# Patient Record
Sex: Female | Born: 1935 | Race: Black or African American | Hispanic: No | Smoking: Never smoker
Health system: Southern US, Community
[De-identification: ages and names within clinical notes are randomized; demographics above are authoritative.]

## PROBLEM LIST (undated history)

## (undated) DIAGNOSIS — I441 Atrioventricular block, second degree: Secondary | ICD-10-CM

## (undated) DIAGNOSIS — F0390 Unspecified dementia without behavioral disturbance: Secondary | ICD-10-CM

## (undated) DIAGNOSIS — M1990 Unspecified osteoarthritis, unspecified site: Secondary | ICD-10-CM

## (undated) DIAGNOSIS — M109 Gout, unspecified: Secondary | ICD-10-CM

## (undated) DIAGNOSIS — I10 Essential (primary) hypertension: Secondary | ICD-10-CM

## (undated) DIAGNOSIS — Z950 Presence of cardiac pacemaker: Secondary | ICD-10-CM

## (undated) HISTORY — PX: CHOLECYSTECTOMY: SHX55

## (undated) HISTORY — DX: Atrioventricular block, second degree: I44.1

## (undated) HISTORY — PX: BACK SURGERY: SHX140

## (undated) HISTORY — PX: CATARACT EXTRACTION, BILATERAL: SHX1313

---

## 1997-07-22 ENCOUNTER — Ambulatory Visit (HOSPITAL_COMMUNITY): Admission: RE | Admit: 1997-07-22 | Discharge: 1997-07-22 | Payer: Self-pay | Admitting: Urology

## 1998-12-04 ENCOUNTER — Emergency Department (HOSPITAL_COMMUNITY): Admission: EM | Admit: 1998-12-04 | Discharge: 1998-12-04 | Payer: Self-pay | Admitting: Emergency Medicine

## 1998-12-04 ENCOUNTER — Encounter: Payer: Self-pay | Admitting: Emergency Medicine

## 1999-05-18 ENCOUNTER — Other Ambulatory Visit: Admission: RE | Admit: 1999-05-18 | Discharge: 1999-05-18 | Payer: Self-pay | Admitting: Geriatric Medicine

## 1999-10-19 ENCOUNTER — Encounter: Payer: Self-pay | Admitting: Emergency Medicine

## 1999-10-19 ENCOUNTER — Emergency Department (HOSPITAL_COMMUNITY): Admission: EM | Admit: 1999-10-19 | Discharge: 1999-10-19 | Payer: Self-pay | Admitting: Ophthalmology

## 1999-12-02 ENCOUNTER — Encounter: Payer: Self-pay | Admitting: Geriatric Medicine

## 1999-12-02 ENCOUNTER — Encounter: Admission: RE | Admit: 1999-12-02 | Discharge: 1999-12-02 | Payer: Self-pay | Admitting: Geriatric Medicine

## 2001-01-03 ENCOUNTER — Encounter: Admission: RE | Admit: 2001-01-03 | Discharge: 2001-01-29 | Payer: Self-pay | Admitting: Geriatric Medicine

## 2001-04-29 ENCOUNTER — Ambulatory Visit (HOSPITAL_COMMUNITY): Admission: RE | Admit: 2001-04-29 | Discharge: 2001-04-29 | Payer: Self-pay | Admitting: Specialist

## 2001-06-19 ENCOUNTER — Ambulatory Visit (HOSPITAL_COMMUNITY): Admission: RE | Admit: 2001-06-19 | Discharge: 2001-06-19 | Payer: Self-pay | Admitting: Specialist

## 2001-09-16 ENCOUNTER — Encounter: Admission: RE | Admit: 2001-09-16 | Discharge: 2001-09-16 | Payer: Self-pay | Admitting: Geriatric Medicine

## 2001-09-16 ENCOUNTER — Encounter: Payer: Self-pay | Admitting: Geriatric Medicine

## 2001-10-29 ENCOUNTER — Encounter: Payer: Self-pay | Admitting: General Surgery

## 2001-10-29 ENCOUNTER — Encounter (INDEPENDENT_AMBULATORY_CARE_PROVIDER_SITE_OTHER): Payer: Self-pay | Admitting: Specialist

## 2001-10-29 ENCOUNTER — Observation Stay (HOSPITAL_COMMUNITY): Admission: RE | Admit: 2001-10-29 | Discharge: 2001-10-30 | Payer: Self-pay | Admitting: General Surgery

## 2002-02-19 ENCOUNTER — Encounter: Payer: Self-pay | Admitting: Geriatric Medicine

## 2002-02-19 ENCOUNTER — Encounter: Admission: RE | Admit: 2002-02-19 | Discharge: 2002-02-19 | Payer: Self-pay | Admitting: Geriatric Medicine

## 2002-05-20 ENCOUNTER — Ambulatory Visit (HOSPITAL_COMMUNITY): Admission: RE | Admit: 2002-05-20 | Discharge: 2002-05-20 | Payer: Self-pay | Admitting: Geriatric Medicine

## 2002-05-29 ENCOUNTER — Encounter: Admission: RE | Admit: 2002-05-29 | Discharge: 2002-05-29 | Payer: Self-pay | Admitting: Geriatric Medicine

## 2002-05-29 ENCOUNTER — Encounter: Payer: Self-pay | Admitting: Geriatric Medicine

## 2002-12-01 ENCOUNTER — Encounter: Admission: RE | Admit: 2002-12-01 | Discharge: 2002-12-01 | Payer: Self-pay | Admitting: Geriatric Medicine

## 2002-12-01 ENCOUNTER — Encounter: Payer: Self-pay | Admitting: Geriatric Medicine

## 2004-04-13 ENCOUNTER — Encounter: Admission: RE | Admit: 2004-04-13 | Discharge: 2004-04-13 | Payer: Self-pay | Admitting: Geriatric Medicine

## 2005-02-17 ENCOUNTER — Observation Stay (HOSPITAL_COMMUNITY): Admission: EM | Admit: 2005-02-17 | Discharge: 2005-02-19 | Payer: Self-pay | Admitting: Emergency Medicine

## 2005-06-27 ENCOUNTER — Encounter: Admission: RE | Admit: 2005-06-27 | Discharge: 2005-06-27 | Payer: Self-pay | Admitting: Geriatric Medicine

## 2006-05-01 ENCOUNTER — Encounter: Admission: RE | Admit: 2006-05-01 | Discharge: 2006-05-01 | Payer: Self-pay | Admitting: Geriatric Medicine

## 2007-05-06 ENCOUNTER — Encounter: Admission: RE | Admit: 2007-05-06 | Discharge: 2007-05-06 | Payer: Self-pay | Admitting: Geriatric Medicine

## 2007-05-22 ENCOUNTER — Encounter: Admission: RE | Admit: 2007-05-22 | Discharge: 2007-05-22 | Payer: Self-pay | Admitting: Geriatric Medicine

## 2007-06-06 ENCOUNTER — Encounter: Admission: RE | Admit: 2007-06-06 | Discharge: 2007-06-06 | Payer: Self-pay | Admitting: Geriatric Medicine

## 2008-02-07 ENCOUNTER — Encounter: Admission: RE | Admit: 2008-02-07 | Discharge: 2008-02-07 | Payer: Self-pay | Admitting: Geriatric Medicine

## 2010-09-02 NOTE — H&P (Signed)
[name] Fields, NASBY NO.:  [id number]   MEDICAL RECORD NO.:  [id number]          PATIENT TYPE:  EMS   LOCATION:  ED                           FACILITY:  [facility]   PHYSICIAN:  Candyce Churn, M.D.DATE OF BIRTH:  1910/05/07   DATE OF ADMISSION:  02/17/2005  DATE OF DISCHARGE:                                HISTORY & PHYSICAL   CHIEF COMPLAINT:  Weakness, tremors, and inability to walk or stand.   HISTORY OF PRESENT ILLNESS:  Ms. [name] Fields is a very pleasant 77-  year-old female with a history of:  1.  Hypertension.  2.  Type 2 diabetes mellitus.  3.  Degenerative disk disease with scoliosis of the spine.  4.  Hiatal hernia with gastroesophageal reflux disease.  5.  Glaucoma.   The patient developed a congestive cough and wheezing 5-6 days ago, and was  seen at [facility], and was started on Avelox 400 mg daily  and nebulizer treatments at home. Today, she took her fifth day of Avelox  and has 5 more days. She started having weakness and tremor today and found  it hard to walk to stand and in the emergency room also has dysmetria. She  has poor finger to nose coordination. She is generally weak and does not  feel like she can stand or walk. She has no focal or localizing symptoms.  She denies fever or chills. Chest congestion has improved and she is  admitted now for inability to walk or stand and wonders if she may be  reacting to Avelox.   PAST MEDICAL HISTORY:  As above.   PAST SURGICAL HISTORY:  1.  Total abdominal hysterectomy secondary to tumor.  2.  Back surgery x3.  3.  Appendectomy.  4.  Laparoscopic cholecystectomy in 2003.   MEDICATIONS:  1.  Glipizide 10 mg daily.  2.  Micardis 40 mg daily.  3.  Travatan eye drops, 1 drop in each eye at bedtime.  4.  Avelox 400 mg daily. Today was the 5th day.  5.  Albuterol nebulizer treatments for 5 days, by history.   FAMILY HISTORY:  Noncontributory.   SOCIAL HISTORY:   No alcohol, no tobacco, no drugs. The patient is widowed.  She used to be a cook. Has 2 children in the emergency room with her. She  has been a member of her church for 72 years and was born and raised and  lived her entire life in [place].   REVIEW OF SYSTEMS:  Denies fever, chills, dysuria, abdominal pain. She feel  1 week ago and still has a mildly sore chest to palpation over the sternum.  No history of congestive heart failure. No PND or orthopnea. Denies  diarrhea.   PHYSICAL EXAMINATION:  GENERAL:  An alert female. Conversive. Elderly. Mild  decreased hearing generally.  HEENT:  Normocephalic and atraumatic.  NECK:  Supple without jugular venous distention.  CHEST:  Clear to auscultation.  CARDIAC:  Regular rate and rhythm. No murmur, rub, or gallop.  ABDOMEN:  Soft, non-tender. Normal bowel sounds. Non-distended.  EXTREMITIES:  Without clubbing,  cyanosis, or edema.  NEUROLOGIC:  She is oriented x3. She has 4/5 strength in the lower  extremities and the upper extremities. No localizing deficits. She is too  weak to stand.  SKIN:  Without rashes.   LABORATORY DATA:  Non-contrast head CT is nonacute.   White count 8900. Hemoglobin 11.6. Platelet count 287,000, normal  differential.  Sodium decreased to 128. Potassium 5.3. chloride 98, bicarb  22, BUN 23, creatinine 1.2, glucose 88.  LFTs are normal. Urinalysis tested  0-2 white cells and 3-6 red cells and a few bacteria. The rest is negative.   ASSESSMENT:  A 77 year old female with treatment for bronchitis for 5 days  and now with bronchitis/respiratory infection x5 days, and now with  increased weakness and incoordination but nonfocal. I wonder if this may be  a drug toxic effect from the quinolone therapy. She could have had possibly  had a small cerebrovascular accident, but I doubt. Certainly it is a  nonfocal examination. She also has a mild to moderate hyponatremia, and this  may be mild syndrome of inappropriate  secretion of antidiuretic hormone.   PLAN:  1.  Discontinue Avelox and nebulizer therapy.  2.  Treat with regular diet and hold on IV fluids for now. May have mild      SIADH.  3.  Physical therapy to try to assess strength in ambulatory status on a      daily basis.  4.  Diabetes mellitus. Use sliding scale regular insulin. Hold Glipizide for      now.  5.  Hypertension. Hold Micardis for now. Restart if and when blood pressure      elevates.  6.  Hyponatremia. Will not treat with IV fluids for now but will follow on a      daily basis. Hopefully, this will slowly improve.      Candyce Churn, M.D.  Electronically Signed     RNG/MEDQ  D:  02/18/2005  T:  02/18/2005  Job:  [id number]   cc:   Hal T. Stoneking, M.D.  Fax: [phone]   Sherin Quarry, MD

## 2010-09-02 NOTE — Discharge Summary (Signed)
NAMEKEGAN, SHEPARDSON              ACCOUNT NO.:  [id number]   MEDICAL RECORD NO.:  [id number]          PATIENT TYPE:  INP   LOCATION:  1511                         FACILITY:  [facility]   PHYSICIAN:  Sherin Quarry, MD      DATE OF BIRTH:  Sep 27, 1910   DATE OF ADMISSION:  02/17/2005  DATE OF DISCHARGE:                                 DISCHARGE SUMMARY   [name] Fields is a 77 year old lady with a history of hypertension and type  2 diabetes who about 6 days prior to admission developed a cough with  associated wheezing. She was seen at [facility] and was  placed on Avelox 400 mg daily as well as nebulizer treatments. On February 17, 2005, the patient began to complain of weakness and tremor, as well as  difficulty with finger-nose coordination. She felt very weak. She was  therefore brought to the [facility] emergency room where she was seen by  Dr. Johnella Moloney. On physical exam, she was alert, she reported some  hearing deficit. Her blood pressure was 173/63, heart rate was 65,  temperature was 98.4. HEENT exam was within normal limits. The chest was  clear. Cardiovascular exam revealed normal S1 and S2 without rubs, murmurs,  or gallops. The abdomen was benign. Neurologic testing and examination  extremities were remarkable only for diffuse weakness. Relevant laboratory  studies obtained during the course of the hospitalization included a CBC  which showed a hemoglobin of 11.6, white count of 8900. CMET was notable for  a sodium of 128. Urinalysis was essentially negative. TSH was normal.  Sedimentation rate was 47. It was Dr. Kevan Ny' impression that the patient  probably was suffering side effects from medications. He did send her for a  CT scan of the brain which showed no evidence of acute intracranial  abnormality. When I saw her on November 4, I noted that she seemed to be  feeling better. She still complained of some leg weakness. She also  apparently fallen as a  result of the weakness and complained of some  soreness in the area around the right eye as well as the chest area  diffusely. These areas were tender to palpation. By November 5 she said that  she was feeling better although she continued to have some aching and  soreness. A repeat BMET showed the sodium level to be 135. It seemed  reasonable to conclude that her symptoms were in fact side effects of  medication and I contacted her family and suggested that she could probably  return home. I advised them to keep Dr. Pete Glatter posted about her overall  status. Therefore, on November 5 the patient was discharged.   DISCHARGE DIAGNOSES:  1.  Weakness, dizziness, and mild tremor probably secondary to medication      side effects.  2.  Diabetes.  3.  History of gastroesophageal reflux.  4.  Glaucoma.   On discharge the patient will resume her usual medications. These are:  1.  Glipizide 10 mg daily.  2.  Micardis 40 mg daily.  3.  Travatan eye drops one  drop each eye at bedtime.   She is counseled not to take Avelox or to use the nebulizer. She is  counseled to report on how she is doing to Dr. Laverle Hobby office next week.           ______________________________  Sherin Quarry, MD     SY/MEDQ  D:  02/19/2005  T:  02/20/2005  Job:  [id number]   cc:   Hal T. Stoneking, M.D.  Fax: [phone]

## 2010-09-02 NOTE — Op Note (Signed)
[facility]  Patient:    ARLEEN, BAR Visit Number: [id number] MRN: [id number]          Service Type: SUR Location: 3W 4782 [id number] Attending Physician:  Carson Myrtle Dictated by:   Sheppard Plumber Earlene Plater, M.D. Proc. Date: 10/29/01 Admit Date:  10/29/2001 Discharge Date: 10/30/2001   CC:         Hal T. Stoneking, M.D.   Operative Report  PREOPERATIVE DIAGNOSIS:  Chronic cholecystolithiasis.  POSTOPERATIVE DIAGNOSIS:  Chronic cholecystolithiasis.  PROCEDURE:  Laparoscopic cholecystectomy and operative cholangiogram.  SURGEON:  Timothy E. Earlene Plater, M.D.  ASSISTANT:  Gita Kudo, M.D.  ANESTHESIA:  General.  INDICATIONS FOR PROCEDURE:  This is an 77 year old female with chronic cholecystolithiasis, multiple small stones, occasional episodes of elevation of SGOT. She has been carefully prepared and is now ready to proceed with laparoscopic cholecystectomy.  DESCRIPTION OF PROCEDURE:  She was taken to the operating room after evaluation by anesthesia and identification, placed supine, general endotracheal anesthesia administered. The abdomen was carefully prepped and draped in the usual fashion. A horizontal incision made infraumbilically after injection of local anesthesia. The fascia identified vertically, the Hasson catheter placed, tied in place with #1 Vicryl. The abdomen insufflated, general peritoneoscopy revealed lower midline infra-abdominal adhesions. The upper abdomen was clear. A second 10 mm trocar placed in the mid epigastrium, two 5 mm trocars in the right upper quadrant. The gallbladder was identified, appeared chronically inflamed, had minimal adhesions. It was grasped and placed on tension. Careful dissection at the base of the gallbladder revealed a normal appearing cystic duct entering the gallbladder with a small cystic artery behind it. These were dissected out completely. The cystic duct was clipped near the  gallbladder. The cystic duct was opened and a catheter was placed there and clipped in place. A real-time cholangiography was done with the C-arm fluoroscopy showing easy smooth flow of dye into the common duct system and into the duodenum. There was no evidence of obstruction of enlargement. The right and left hepatic radicles did show up. I saw no abnormality. We proceeded to remove the catheter, triply clip the remnant of the cystic duct and completely divide it. Behind that was a normal appearing cystic artery that was quadruply clipped and divided. The gallbladder was then removed from the gallbladder bed without complications or incident. The bed of the gallbladder was cauterized to control oozing. The gallbladder bed was dry, it was copiously irrigated. The gallbladder was then removed through the infraumbilical incision which was then tied under direct vision. Copious irrigation was used, i.e. 2 liters. All irrigant removed. Inspection of all areas was negative. Then all irrigant, CO2, instruments and trocars removed under direct vision. She had tolerated it well, the counts were correct. Each incision checked and cauterized where needed and the skin closed with 3-0 Monocryl. Steri-Strips applied, dry sterile dressing applied and she was awakened and taken to the recovery room. Final counts correct. Dictated by:   Sheppard Plumber Earlene Plater, M.D. Attending Physician:  Carson Myrtle DD:  10/29/01 TD:  11/01/01 Job: [id number]

## 2011-01-29 ENCOUNTER — Emergency Department (HOSPITAL_COMMUNITY): Payer: Medicare Other

## 2011-01-29 ENCOUNTER — Emergency Department (HOSPITAL_COMMUNITY)
Admission: EM | Admit: 2011-01-29 | Discharge: 2011-01-29 | Disposition: A | Discharge status: Home / Self Care | Payer: Medicare Other | Attending: Emergency Medicine | Admitting: Emergency Medicine

## 2011-01-29 DIAGNOSIS — E119 Type 2 diabetes mellitus without complications: Secondary | ICD-10-CM | POA: Insufficient documentation

## 2011-01-29 DIAGNOSIS — M109 Gout, unspecified: Secondary | ICD-10-CM | POA: Insufficient documentation

## 2011-01-29 DIAGNOSIS — I10 Essential (primary) hypertension: Secondary | ICD-10-CM | POA: Insufficient documentation

## 2011-01-29 DIAGNOSIS — M25539 Pain in unspecified wrist: Secondary | ICD-10-CM | POA: Insufficient documentation

## 2011-01-29 LAB — GLUCOSE, CAPILLARY: Glucose-Capillary: 60 mg/dL — ABNORMAL LOW (ref 70–99)

## 2011-01-29 LAB — URIC ACID: Uric Acid, Serum: 8.4 mg/dL — ABNORMAL HIGH (ref 2.4–7.0)

## 2011-03-18 DIAGNOSIS — M109 Gout, unspecified: Secondary | ICD-10-CM

## 2011-03-18 HISTORY — DX: Gout, unspecified: M10.9

## 2011-04-17 ENCOUNTER — Emergency Department (HOSPITAL_COMMUNITY): Payer: Medicare Other

## 2011-04-17 ENCOUNTER — Inpatient Hospital Stay (HOSPITAL_COMMUNITY)
Admission: EM | Admit: 2011-04-17 | Discharge: 2011-04-21 | DRG: 244 | Disposition: A | Discharge status: Skilled Nursing Facility | Payer: Medicare Other | Attending: Internal Medicine | Admitting: Internal Medicine

## 2011-04-17 ENCOUNTER — Encounter: Payer: Self-pay | Admitting: Emergency Medicine

## 2011-04-17 DIAGNOSIS — Z888 Allergy status to other drugs, medicaments and biological substances status: Secondary | ICD-10-CM

## 2011-04-17 DIAGNOSIS — E119 Type 2 diabetes mellitus without complications: Secondary | ICD-10-CM | POA: Diagnosis present

## 2011-04-17 DIAGNOSIS — I441 Atrioventricular block, second degree: Secondary | ICD-10-CM

## 2011-04-17 DIAGNOSIS — Z79899 Other long term (current) drug therapy: Secondary | ICD-10-CM

## 2011-04-17 DIAGNOSIS — I10 Essential (primary) hypertension: Secondary | ICD-10-CM | POA: Diagnosis present

## 2011-04-17 DIAGNOSIS — I447 Left bundle-branch block, unspecified: Secondary | ICD-10-CM | POA: Diagnosis present

## 2011-04-17 HISTORY — DX: Unspecified osteoarthritis, unspecified site: M19.90

## 2011-04-17 HISTORY — DX: Essential (primary) hypertension: I10

## 2011-04-17 HISTORY — DX: Gout, unspecified: M10.9

## 2011-04-17 LAB — CBC
HCT: 34 % — ABNORMAL LOW (ref 36.0–46.0)
Hemoglobin: 11 g/dL — ABNORMAL LOW (ref 12.0–15.0)
MCH: 29.7 pg (ref 26.0–34.0)
MCHC: 32.4 g/dL (ref 30.0–36.0)
MCV: 91.9 fL (ref 78.0–100.0)
Platelets: 233 10*3/uL (ref 150–400)
RBC: 3.7 MIL/uL — ABNORMAL LOW (ref 3.87–5.11)
RDW: 13.4 % (ref 11.5–15.5)
WBC: 7.6 10*3/uL (ref 4.0–10.5)

## 2011-04-17 LAB — DIFFERENTIAL
Basophils Absolute: 0 10*3/uL (ref 0.0–0.1)
Basophils Relative: 0 % (ref 0–1)
Eosinophils Absolute: 0.1 10*3/uL (ref 0.0–0.7)
Eosinophils Relative: 1 % (ref 0–5)
Lymphocytes Relative: 28 % (ref 12–46)
Lymphs Abs: 2.1 10*3/uL (ref 0.7–4.0)
Monocytes Absolute: 0.7 10*3/uL (ref 0.1–1.0)
Monocytes Relative: 10 % (ref 3–12)
Neutro Abs: 4.6 10*3/uL (ref 1.7–7.7)
Neutrophils Relative %: 61 % (ref 43–77)

## 2011-04-17 LAB — COMPREHENSIVE METABOLIC PANEL
ALT: 15 U/L (ref 0–35)
AST: 20 U/L (ref 0–37)
Albumin: 3.5 g/dL (ref 3.5–5.2)
Alkaline Phosphatase: 44 U/L (ref 39–117)
BUN: 38 mg/dL — ABNORMAL HIGH (ref 6–23)
CO2: 23 mEq/L (ref 19–32)
Calcium: 9.7 mg/dL (ref 8.4–10.5)
Chloride: 109 mEq/L (ref 96–112)
Creatinine, Ser: 1.46 mg/dL — ABNORMAL HIGH (ref 0.50–1.10)
GFR calc Af Amer: 33 mL/min — ABNORMAL LOW (ref >90)
GFR calc non Af Amer: 29 mL/min — ABNORMAL LOW (ref >90)
Glucose, Bld: 118 mg/dL — ABNORMAL HIGH (ref 70–99)
Potassium: 5.6 mEq/L — ABNORMAL HIGH (ref 3.5–5.1)
Sodium: 140 mEq/L (ref 135–145)
Total Bilirubin: 0.2 mg/dL — ABNORMAL LOW (ref 0.3–1.2)
Total Protein: 7.3 g/dL (ref 6.0–8.3)

## 2011-04-17 LAB — CARDIAC PANEL(CRET KIN+CKTOT+MB+TROPI)
CK, MB: 3.9 ng/mL (ref 0.3–4.0)
Relative Index: 3.5 — ABNORMAL HIGH (ref 0.0–2.5)
Total CK: 113 U/L (ref 7–177)
Troponin I: <0.3 ng/mL (ref <0.30)

## 2011-04-17 MED ORDER — SODIUM POLYSTYRENE SULFONATE 15 GM/60ML PO SUSP
15.0000 g | Freq: Once | ORAL | Status: AC
  Administered 2011-04-18: 15 g via ORAL
  Filled 2011-04-17: qty 60

## 2011-04-17 NOTE — ED Notes (Signed)
Pt had episode of confusion yesterday and refused vist MD until today.  HR on assessment was 30. EMS confirmed 30.  Pt is diabetic 143. IV rt AC 20g.

## 2011-04-17 NOTE — H&P (Signed)
[name] Fields is an 77 y.o. female.    Chief Complaint: None  HPI: 77 y/o female with a history of HTN that has been treated with Bisoprolol/HCTZ 2.5/6.25mg  q daily for "many years" presented to ED upon the advice of her PCP who performed a routine ECG today and found the patient to be in 2:1 AV block.  Patient is completely asymptomatic. Specifically, she denies chest pain, shortness of breath, PND, orthopnea, dizziness, lightheadedness or syncope.  Per the patient's son, she has a sedentary lifestyle.  She normally is only able to walk around the house on a good day.  Her ECG shows baseline LBBB with 2:1 AVB (heart rate 27 bpm) and she is otherwise hemodynamically stable. Her first sets of cardiac markers are within normal limits.  Past Medical History  Diagnosis Date  . Diabetes mellitus   . Hypertension     Past Surgical History  Procedure Date  . Cholecystectomy     History reviewed. No pertinent family history. Social History:  does not have a smoking history on file. She does not have any smokeless tobacco history on file. Her alcohol and drug histories not on file.  Allergies:  Allergies  Allergen Reactions  . Avelox (Moxifloxacin Hydrochloride)     Causes weakness  . Lyrica     Causes dizziness  . Micardis (Telmisartan)     Causes hyperkalemia  . Neurontin (Gabapentin)     Causes dizziness  . Oxycontin     Causes dizziness    No current facility-administered medications on file as of 04/17/2011.   No current outpatient prescriptions on file as of 04/17/2011.    Results for orders placed during the hospital encounter of 04/17/11 (from the past 48 hour(s))  CBC     Status: Abnormal   Collection Time   04/17/11  6:17 PM      Component Value Range Comment   WBC 7.6  4.0 - 10.5 (K/uL)    RBC 3.70 (*) 3.87 - 5.11 (MIL/uL)    Hemoglobin 11.0 (*) 12.0 - 15.0 (g/dL)    HCT 82.9 (*) 56.2 - 46.0 (%)    MCV 91.9  78.0 - 100.0 (fL)    MCH 29.7  26.0 - 34.0 (pg)    MCHC 32.4  30.0 - 36.0 (g/dL)    RDW 13.0  86.5 - 78.4 (%)    Platelets 233  150 - 400 (K/uL)   DIFFERENTIAL     Status: Normal   Collection Time   04/17/11  6:17 PM      Component Value Range Comment   Neutrophils Relative 61  43 - 77 (%)    Neutro Abs 4.6  1.7 - 7.7 (K/uL)    Lymphocytes Relative 28  12 - 46 (%)    Lymphs Abs 2.1  0.7 - 4.0 (K/uL)    Monocytes Relative 10  3 - 12 (%)    Monocytes Absolute 0.7  0.1 - 1.0 (K/uL)    Eosinophils Relative 1  0 - 5 (%)    Eosinophils Absolute 0.1  0.0 - 0.7 (K/uL)    Basophils Relative 0  0 - 1 (%)    Basophils Absolute 0.0  0.0 - 0.1 (K/uL)   COMPREHENSIVE METABOLIC PANEL     Status: Abnormal   Collection Time   04/17/11  6:17 PM      Component Value Range Comment   Sodium 140  135 - 145 (mEq/L)    Potassium 5.6 (*) 3.5 - 5.1 (mEq/L)  Chloride 109  96 - 112 (mEq/L)    CO2 23  19 - 32 (mEq/L)    Glucose, Bld 118 (*) 70 - 99 (mg/dL)    BUN 38 (*) 6 - 23 (mg/dL)    Creatinine, Ser 4.09 (*) 0.50 - 1.10 (mg/dL)    Calcium 9.7  8.4 - 10.5 (mg/dL)    Total Protein 7.3  6.0 - 8.3 (g/dL)    Albumin 3.5  3.5 - 5.2 (g/dL)    AST 20  0 - 37 (U/L)    ALT 15  0 - 35 (U/L)    Alkaline Phosphatase 44  39 - 117 (U/L)    Total Bilirubin 0.2 (*) 0.3 - 1.2 (mg/dL)    GFR calc non Af Amer 29 (*) >90 (mL/min)    GFR calc Af Amer 33 (*) >90 (mL/min)   CARDIAC PANEL(CRET KIN+CKTOT+MB+TROPI)     Status: Abnormal   Collection Time   04/17/11  6:20 PM      Component Value Range Comment   Total CK 113  7 - 177 (U/L)    CK, MB 3.9  0.3 - 4.0 (ng/mL)    Troponin I <0.30  <0.30 (ng/mL)    Relative Index 3.5 (*) 0.0 - 2.5     Dg Chest Portable 1 View  04/17/2011  *RADIOLOGY REPORT*  Clinical Data: Complete heart block  PORTABLE CHEST - 1 VIEW  Comparison: 05/01/2006  Findings: Chronic interstitial markings. No pleural effusion or pneumothorax.  Stable mild cardiomegaly.  IMPRESSION: No evidence of acute cardiopulmonary disease.  Stable cardiomegaly  with chronic interstitial markings.  Original Report Authenticated By: Charline Bills, M.D.    Review of Systems  Constitutional: Negative.   HENT: Negative.   Eyes: Negative.   Respiratory: Negative.   Cardiovascular: Negative.   Gastrointestinal: Negative.   Skin: Negative.   Neurological: Negative.   Endo/Heme/Allergies: Negative.   Psychiatric/Behavioral: Negative.     Blood pressure 118/70, pulse 36, temperature 98.4 F (36.9 C), temperature source Oral, resp. rate 16, SpO2 100.00%. Physical Exam  Constitutional: She is oriented to person, place, and time. She appears well-nourished.  HENT:  Head: Normocephalic.  Eyes: EOM are normal.  Neck: Normal range of motion. Neck supple. No JVD present. No tracheal deviation present. No thyromegaly present.  Cardiovascular: Normal heart sounds.   Murmur: Right carotid bruit. Respiratory: Effort normal and breath sounds normal. No respiratory distress. She has no wheezes. She has no rales.  GI: Soft. Bowel sounds are normal.  Musculoskeletal: Normal range of motion. She exhibits no edema and no tenderness.  Lymphadenopathy:    She has no cervical adenopathy.  Neurological: She is alert and oriented to person, place, and time.  Skin: No rash noted. She is not diaphoretic. No erythema. No pallor.  Psychiatric: She has a normal mood and affect.     Assessment/Plan  1. Second degree type 2 AV block.  She has been on Bisoprolol/HCTZ 2.5/6.25 g for many years for treatment of HTN, and it is possible that Bisoprolol could lead to iatrogenic heart block.  However, given her old age and the fact that Bisoprolol is at a low dose, it is also possible that her heart block could be due to age-dependent conduction disease. I will d/c Bisoprolol, and observe the patient overnight on telemetry.  She has transcutaneous pacer pads on already.  If there is no improvement is her heart block tomorrow, will consider getting EP involved for permanent  pacemaker implantation.  2.  HTN:  I will d/c Bisoprolol and leave the patient on HCTZ only.  Cyris Maalouf E 04/17/2011, 9:05 PM

## 2011-04-17 NOTE — ED Notes (Signed)
Two family members at bedside stated that patient lives with another family member who was alone at home at 1700 one day ago. Received a phone call and that person said patient had slurred speech. Another family member arrived shortly to see patient and states symptoms resolved at 1800.  Seen Doctor today for evaluation and sent to ED for evaluation for low heart rate. Patient today ax4 calm cooperative answering and following questions appropriate.  Airway intact bilateral equal chest rise and fall.

## 2011-04-17 NOTE — ED Notes (Signed)
Family at bedside. 

## 2011-04-17 NOTE — ED Notes (Signed)
Dr Katha Cabal called RE potassium of 5.6

## 2011-04-17 NOTE — ED Notes (Signed)
Placed patient on Zoll with pads at 1735.

## 2011-04-17 NOTE — ED Notes (Signed)
Assisted PT to bathroom.

## 2011-04-17 NOTE — ED Provider Notes (Signed)
History     CSN: [id number]  Arrival date & time 04/17/11  1727   First MD Initiated Contact with Patient 04/17/11 1728      Chief Complaint  Patient presents with  . Bradycardia    (Consider location/radiation/quality/duration/timing/severity/associated sxs/prior treatment) HPI Patient brought in after missing her private doctor's office where she was found to have a significant bradycardia.  Patient is asymptomatic for bradycardia.  Patient denies dizziness or near syncope feeling.  Patient denies shortness of breath or chest pain. Past Medical History  Diagnosis Date  . Diabetes mellitus   . Hypertension     Past Surgical History  Procedure Date  . Cholecystectomy     History reviewed. No pertinent family history.  History  Substance Use Topics  . Smoking status: Not on file  . Smokeless tobacco: Not on file  . Alcohol Use:     OB History    Grav Para Term Preterm Abortions TAB SAB Ect Mult Living                  Review of Systems  All other systems reviewed and are negative.    Allergies  Avelox; Lyrica; Micardis; Neurontin; and Oxycontin  Home Medications   Current Outpatient Rx  Name Route Sig Dispense Refill  . ACETAMINOPHEN 500 MG PO TABS Oral Take 500 mg by mouth once as needed. For pain.     Marland Kitchen BISOPROLOL-HYDROCHLOROTHIAZIDE 2.5-6.25 MG PO TABS Oral Take 1 tablet by mouth daily.      . DULOXETINE HCL 30 MG PO CPEP Oral Take 30 mg by mouth daily.      Marland Kitchen GLIPIZIDE ER 5 MG PO TB24 Oral Take 5 mg by mouth daily.      Marland Kitchen MAGNESIUM HYDROXIDE 400 MG/5ML PO SUSP Oral Take 30 mLs by mouth daily.      Marland Kitchen POLYETHYLENE GLYCOL 3350 PO POWD Oral Take 17 g by mouth once as needed.      . SENNA-DOCUSATE SODIUM 8.6-50 MG PO TABS Oral Take 1 tablet by mouth daily as needed. For constipation     . TRAMADOL HCL 50 MG PO TABS Oral Take 50 mg by mouth every 8 (eight) hours as needed. For pain. Maximum dose= 8 tablets per day       BP 118/70  Pulse 45  Temp(Src)  98.1 F (36.7 C) (Oral)  Resp 16  SpO2 97%  Physical Exam  Nursing note and vitals reviewed. Constitutional: She is oriented to person, place, and time. She appears well-developed and well-nourished. No distress.  HENT:  Head: Normocephalic and atraumatic.  Eyes: Pupils are equal, round, and reactive to light.  Neck: Normal range of motion.  Cardiovascular: Intact distal pulses.  Bradycardia present.          Date: 04/17/2011  Rate: 27  Rhythm: sinus bradycardia  QRS Axis: normal  Intervals: normal  ST/T Wave abnormalities: normal  Conduction Disutrbances:second-degree A-V block, ( Mobitz II which is new) LBBB  Old EKG Reviewed: yes     Pulmonary/Chest: No respiratory distress.  Abdominal: Normal appearance. She exhibits no distension.  Musculoskeletal: Normal range of motion.  Neurological: She is alert and oriented to person, place, and time. No cranial nerve deficit.  Skin: Skin is warm and dry. No rash noted.  Psychiatric: She has a normal mood and affect. Her behavior is normal.    ED Course  Procedures (including critical care time) I discussed the case with the cardiologist on call who came to the  emergency department for her evaluation and probable admission Labs Reviewed  CBC - Abnormal; Notable for the following:    RBC 3.70 (*)    Hemoglobin 11.0 (*)    HCT 34.0 (*)    All other components within normal limits  COMPREHENSIVE METABOLIC PANEL - Abnormal; Notable for the following:    Potassium 5.6 (*)    Glucose, Bld 118 (*)    BUN 38 (*)    Creatinine, Ser 1.46 (*)    Total Bilirubin 0.2 (*)    GFR calc non Af Amer 29 (*)    GFR calc Af Amer 33 (*)    All other components within normal limits  CARDIAC PANEL(CRET KIN+CKTOT+MB+TROPI) - Abnormal; Notable for the following:    Relative Index 3.5 (*)    All other components within normal limits  DIFFERENTIAL   Dg Chest Portable 1 View  04/17/2011  *RADIOLOGY REPORT*  Clinical Data: Complete heart block   PORTABLE CHEST - 1 VIEW  Comparison: 05/01/2006  Findings: Chronic interstitial markings. No pleural effusion or pneumothorax.  Stable mild cardiomegaly.  IMPRESSION: No evidence of acute cardiopulmonary disease.  Stable cardiomegaly with chronic interstitial markings.  Original Report Authenticated By: Charline Bills, M.D.     1. AV block, Mobitz 2       MDM          Nelia Shi, MD 04/17/11 2138

## 2011-04-18 DIAGNOSIS — I441 Atrioventricular block, second degree: Secondary | ICD-10-CM

## 2011-04-18 DIAGNOSIS — I442 Atrioventricular block, complete: Secondary | ICD-10-CM

## 2011-04-18 LAB — CARDIAC PANEL(CRET KIN+CKTOT+MB+TROPI)
CK, MB: 3.4 ng/mL (ref 0.3–4.0)
CK, MB: 3.1 ng/mL (ref 0.3–4.0)
CK, MB: 3.3 ng/mL (ref 0.3–4.0)
Relative Index: INVALID (ref 0.0–2.5)
Troponin I: <0.3 ng/mL (ref <0.30)
Troponin I: <0.3 ng/mL (ref <0.30)

## 2011-04-18 LAB — BASIC METABOLIC PANEL
BUN: 31 mg/dL — ABNORMAL HIGH (ref 6–23)
CO2: 23 mEq/L (ref 19–32)
Chloride: 108 mEq/L (ref 96–112)
Creatinine, Ser: 1.09 mg/dL (ref 0.50–1.10)
Potassium: 5.1 mEq/L (ref 3.5–5.1)

## 2011-04-18 LAB — CBC
Hemoglobin: 11 g/dL — ABNORMAL LOW (ref 12.0–15.0)
RBC: 3.7 MIL/uL — ABNORMAL LOW (ref 3.87–5.11)

## 2011-04-18 LAB — PROTIME-INR: Prothrombin Time: 14.9 seconds (ref 11.6–15.2)

## 2011-04-18 LAB — GLUCOSE, CAPILLARY: Glucose-Capillary: 150 mg/dL — ABNORMAL HIGH (ref 70–99)

## 2011-04-18 LAB — LIPID PANEL
Cholesterol: 150 mg/dL (ref 0–200)
LDL Cholesterol: 86 mg/dL (ref 0–99)
VLDL: 19 mg/dL (ref 0–40)

## 2011-04-18 LAB — MRSA PCR SCREENING: MRSA by PCR: NEGATIVE

## 2011-04-18 LAB — CREATININE, SERUM
Creatinine, Ser: 1.23 mg/dL — ABNORMAL HIGH (ref 0.50–1.10)
GFR calc non Af Amer: 35 mL/min — ABNORMAL LOW (ref >90)

## 2011-04-18 LAB — APTT: aPTT: 33 seconds (ref 24–37)

## 2011-04-18 MED ORDER — HYDROCHLOROTHIAZIDE 12.5 MG PO CAPS
12.5000 mg | ORAL_CAPSULE | Freq: Every day | ORAL | Status: DC
  Administered 2011-04-18 – 2011-04-21 (×4): 12.5 mg via ORAL
  Filled 2011-04-18 (×4): qty 1

## 2011-04-18 MED ORDER — SODIUM CHLORIDE 0.9 % IJ SOLN
3.0000 mL | Freq: Two times a day (BID) | INTRAMUSCULAR | Status: DC
  Administered 2011-04-18 (×2): 3 mL via INTRAVENOUS

## 2011-04-18 MED ORDER — DULOXETINE HCL 30 MG PO CPEP
30.0000 mg | ORAL_CAPSULE | Freq: Every day | ORAL | Status: DC
  Administered 2011-04-18 – 2011-04-21 (×4): 30 mg via ORAL
  Filled 2011-04-18 (×4): qty 1

## 2011-04-18 MED ORDER — CHLORHEXIDINE GLUCONATE 4 % EX LIQD
Freq: Once | CUTANEOUS | Status: AC
  Administered 2011-04-19: 05:00:00 via TOPICAL
  Filled 2011-04-18: qty 60

## 2011-04-18 MED ORDER — SENNA-DOCUSATE SODIUM 8.6-50 MG PO TABS
1.0000 | ORAL_TABLET | Freq: Every day | ORAL | Status: DC | PRN
  Filled 2011-04-18: qty 1

## 2011-04-18 MED ORDER — ENOXAPARIN SODIUM 40 MG/0.4ML ~~LOC~~ SOLN
40.0000 mg | SUBCUTANEOUS | Status: DC
  Administered 2011-04-18: 40 mg via SUBCUTANEOUS
  Filled 2011-04-18 (×3): qty 0.4

## 2011-04-18 MED ORDER — SODIUM CHLORIDE 0.9 % IV SOLN
INTRAVENOUS | Status: DC
  Administered 2011-04-19 (×2): via INTRAVENOUS

## 2011-04-18 MED ORDER — MAGNESIUM HYDROXIDE 400 MG/5ML PO SUSP
30.0000 mL | Freq: Every day | ORAL | Status: DC
  Administered 2011-04-19: 30 mL via ORAL
  Filled 2011-04-18 (×4): qty 30

## 2011-04-18 MED ORDER — ONDANSETRON HCL 4 MG/2ML IJ SOLN: 4.0000 mg | Freq: Four times a day (QID) | INTRAMUSCULAR | Status: DC | PRN

## 2011-04-18 MED ORDER — GLIPIZIDE ER 5 MG PO TB24
5.0000 mg | ORAL_TABLET | Freq: Every day | ORAL | Status: DC
  Filled 2011-04-18 (×2): qty 1

## 2011-04-18 MED ORDER — SODIUM CHLORIDE 0.9 % IV SOLN: 250.0000 mL | INTRAVENOUS | Status: DC | PRN

## 2011-04-18 MED ORDER — TRAMADOL HCL 50 MG PO TABS
50.0000 mg | ORAL_TABLET | Freq: Four times a day (QID) | ORAL | Status: DC | PRN
  Filled 2011-04-18: qty 1

## 2011-04-18 MED ORDER — ACETAMINOPHEN 325 MG PO TABS
650.0000 mg | ORAL_TABLET | ORAL | Status: DC | PRN
  Administered 2011-04-19: 650 mg via ORAL
  Filled 2011-04-18: qty 2

## 2011-04-18 MED ORDER — INSULIN ASPART 100 UNIT/ML ~~LOC~~ SOLN
0.0000 [IU] | Freq: Three times a day (TID) | SUBCUTANEOUS | Status: DC
  Administered 2011-04-18: 1 [IU] via SUBCUTANEOUS
  Administered 2011-04-20: 5 [IU] via SUBCUTANEOUS
  Administered 2011-04-20: 2 [IU] via SUBCUTANEOUS
  Administered 2011-04-21 (×2): 1 [IU] via SUBCUTANEOUS
  Filled 2011-04-18: qty 3

## 2011-04-18 MED ORDER — SODIUM POLYSTYRENE SULFONATE 15 GM/60ML PO SUSP
15.0000 g | Freq: Once | ORAL | Status: AC
  Administered 2011-04-18: 15 g via ORAL
  Filled 2011-04-18: qty 60

## 2011-04-18 MED ORDER — SODIUM CHLORIDE 0.9 % IJ SOLN: 3.0000 mL | INTRAMUSCULAR | Status: DC | PRN

## 2011-04-18 MED ORDER — POLYETHYLENE GLYCOL 3350 17 GM/SCOOP PO POWD
17.0000 g | Freq: Once | ORAL | Status: AC | PRN
  Filled 2011-04-18: qty 255

## 2011-04-18 MED ORDER — ACETAMINOPHEN 500 MG PO TABS: 500.0000 mg | ORAL_TABLET | Freq: Once | ORAL | Status: AC | PRN

## 2011-04-18 MED ORDER — CHLORHEXIDINE GLUCONATE 4 % EX LIQD
Freq: Once | CUTANEOUS | Status: AC
  Administered 2011-04-18: 22:00:00 via TOPICAL
  Filled 2011-04-18: qty 60

## 2011-04-18 MED ORDER — SODIUM CHLORIDE 0.9 % IR SOLN
80.0000 mg | Status: DC
  Filled 2011-04-18: qty 2

## 2011-04-18 NOTE — Progress Notes (Signed)
CBG: 57  Treatment: 15 GM carbohydrate snack  Symptoms: None  Follow-up CBG: Time:0905 CBG Result:72  Possible Reasons for Event: Unknown  Comments/MD notified:Peter Nishan.  Iniitiated Hypoglygemia Protocol and CBG checks.    Gaspar Garbe Smothers

## 2011-04-18 NOTE — Progress Notes (Signed)
  Echocardiogram 2D Echocardiogram has been performed.  Juanita Laster Boden Stucky, RDCS 04/18/2011, 2:55 PM

## 2011-04-18 NOTE — Progress Notes (Signed)
Patient ID: [name] Fields, female   DOB: 01-Dec-1910, 78 y.o.   MRN: [id number] @ Subjective:  Denies SSCP, palpitations or Dyspnea Tingling and numbness in LUE  Objective:  Filed Vitals:   04/18/11 0447 04/18/11 0500 04/18/11 0600 04/18/11 0700  BP: 170/42  167/34 133/39  Pulse:  44 43 44  Temp:      TempSrc:      Resp:  17 22 20   Height:      Weight:      SpO2:  98% 97% 97%    Intake/Output from previous day:  Intake/Output Summary (Last 24 hours) at 04/18/11 0831 Last data filed at 04/18/11 0100  Gross per 24 hour  Intake    120 ml  Output      0 ml  Net    120 ml    Physical Exam: Affect appropriate Elderly HEENT: normal Neck supple with no adenopathy JVP normal no bruits no thyromegaly Lungs clear with no wheezing and good diaphragmatic motion Heart:  S1/S2 systolic murmur,rub, gallop or click PMI normal Abdomen: benighn, BS positve, no tenderness, no AAA no bruit.  No HSM or HJR Distal pulses intact with no bruits No edema Neuro non-focal Skin warm and dry Arthritis   Lab Results: Basic Metabolic Panel:  Basename 04/18/11 0044 04/17/11 1817  NA -- 140  K -- 5.6*  CL -- 109  CO2 -- 23  GLUCOSE -- 118*  BUN -- 38*  CREATININE 1.23* 1.46*  CALCIUM -- 9.7  MG -- --  PHOS -- --   Liver Function Tests:  [facility] 04/17/11 1817  AST 20  ALT 15  ALKPHOS 44  BILITOT 0.2*  PROT 7.3  ALBUMIN 3.5   No results found for this basename: LIPASE:2,AMYLASE:2 in the last 72 hours CBC:  Basename 04/18/11 0044 04/17/11 1817  WBC 7.8 7.6  NEUTROABS -- 4.6  HGB 11.0* 11.0*  HCT 34.0* 34.0*  MCV 91.9 91.9  PLT 228 233   Cardiac Enzymes:  Basename 04/18/11 0515 04/18/11 0043 04/17/11 1820  CKTOTAL 85 97 113  CKMB 3.1 3.4 3.9  CKMBINDEX -- -- --  TROPONINI <0.30 <0.30 <0.30   BNP: No components found with this basename: POCBNP:3 D-Dimer: No results found for this basename: DDIMER:2 in the last 72 hours Hemoglobin A1C: No results found for this  basename: HGBA1C in the last 72 hours Fasting Lipid Panel:  Basename 04/18/11 0515  CHOL 150  HDL 45  LDLCALC 86  TRIG 93  CHOLHDL 3.3  LDLDIRECT --    Imaging: Dg Chest Portable 1 View  04/17/2011  *RADIOLOGY REPORT*  Clinical Data: Complete heart block  PORTABLE CHEST - 1 VIEW  Comparison: 05/01/2006  Findings: Chronic interstitial markings. No pleural effusion or pneumothorax.  Stable mild cardiomegaly.  IMPRESSION: No evidence of acute cardiopulmonary disease.  Stable cardiomegaly with chronic interstitial markings.  Original Report Authenticated By: Charline Bills, M.D.    Cardiac Studies:  ECG:  2:1 CHB with LBBB   Telemetry: HR 40 with CHB  Medications:     . DULoxetine  30 mg Oral Daily  . enoxaparin  40 mg Subcutaneous Q24H  . glipiZIDE  5 mg Oral Q breakfast  . hydrochlorothiazide  12.5 mg Oral Daily  . magnesium hydroxide  30 mL Oral Daily  . sodium chloride  3 mL Intravenous Q12H  . sodium polystyrene  15 g Oral Once       Assessment/Plan:  CHB:  Likely been going on for awhile.  Give  one dose of Kayexylate.  Hydrate.  Long discussion with family and patient.  She is willing to  Have a pacer placed.  Will check echo for RV/LV function  Enzymes are negative DM:  BS is low at 56  Will stop glipizide HTN:  Will not Rx agressively given CHB and low HR  Charlton Haws 04/18/2011, 8:31 AM

## 2011-04-19 ENCOUNTER — Encounter (HOSPITAL_COMMUNITY): Payer: Self-pay

## 2011-04-19 ENCOUNTER — Encounter (HOSPITAL_COMMUNITY)
Admission: EM | Disposition: A | Discharge status: Skilled Nursing Facility | Payer: Self-pay | Source: Home / Self Care | Attending: Internal Medicine

## 2011-04-19 DIAGNOSIS — I441 Atrioventricular block, second degree: Secondary | ICD-10-CM

## 2011-04-19 DIAGNOSIS — I442 Atrioventricular block, complete: Secondary | ICD-10-CM

## 2011-04-19 HISTORY — PX: PERMANENT PACEMAKER INSERTION: SHX5480

## 2011-04-19 HISTORY — PX: PACEMAKER INSERTION: SHX728

## 2011-04-19 LAB — BASIC METABOLIC PANEL
BUN: 30 mg/dL — ABNORMAL HIGH (ref 6–23)
CO2: 22 mEq/L (ref 19–32)
Chloride: 108 mEq/L (ref 96–112)
Creatinine, Ser: 0.98 mg/dL (ref 0.50–1.10)
Potassium: 5 mEq/L (ref 3.5–5.1)

## 2011-04-19 LAB — GLUCOSE, CAPILLARY: Glucose-Capillary: 90 mg/dL (ref 70–99)

## 2011-04-19 SURGERY — PERMANENT PACEMAKER INSERTION
Anesthesia: Moderate Sedation | Laterality: Bilateral

## 2011-04-19 MED ORDER — SODIUM CHLORIDE 0.45 % IV SOLN
INTRAVENOUS | Status: DC
  Administered 2011-04-19: 09:00:00 via INTRAVENOUS

## 2011-04-19 MED ORDER — ONDANSETRON HCL 4 MG/2ML IJ SOLN: 4.0000 mg | Freq: Four times a day (QID) | INTRAMUSCULAR | Status: DC | PRN

## 2011-04-19 MED ORDER — SODIUM CHLORIDE 0.9 % IV SOLN: 250.0000 mL | INTRAVENOUS | Status: DC | PRN

## 2011-04-19 MED ORDER — ACETAMINOPHEN 325 MG PO TABS: 325.0000 mg | ORAL_TABLET | ORAL | Status: DC | PRN

## 2011-04-19 MED ORDER — ACETAMINOPHEN 500 MG PO TABS
1000.0000 mg | ORAL_TABLET | Freq: Four times a day (QID) | ORAL | Status: AC
  Administered 2011-04-19 – 2011-04-20 (×2): 1000 mg via ORAL
  Filled 2011-04-19 (×3): qty 2

## 2011-04-19 MED ORDER — HEPARIN (PORCINE) IN NACL 2-0.9 UNIT/ML-% IJ SOLN
INTRAMUSCULAR | Status: AC
  Filled 2011-04-19: qty 1000

## 2011-04-19 MED ORDER — SODIUM CHLORIDE 0.9 % IJ SOLN
3.0000 mL | Freq: Two times a day (BID) | INTRAMUSCULAR | Status: DC
  Administered 2011-04-19 – 2011-04-21 (×4): 3 mL via INTRAVENOUS

## 2011-04-19 MED ORDER — BISOPROLOL FUMARATE 5 MG PO TABS
2.5000 mg | ORAL_TABLET | Freq: Every day | ORAL | Status: DC
  Administered 2011-04-19: 2.5 mg via ORAL
  Administered 2011-04-20: 09:00:00 via ORAL
  Administered 2011-04-21: 2.5 mg via ORAL
  Filled 2011-04-19 (×3): qty 0.5

## 2011-04-19 MED ORDER — CEFAZOLIN SODIUM 1-5 GM-% IV SOLN
1.0000 g | Freq: Four times a day (QID) | INTRAVENOUS | Status: AC
  Administered 2011-04-19 – 2011-04-20 (×3): 1 g via INTRAVENOUS
  Filled 2011-04-19 (×3): qty 50

## 2011-04-19 MED ORDER — CEFAZOLIN SODIUM 1-5 GM-% IV SOLN
1.0000 g | INTRAVENOUS | Status: DC
  Filled 2011-04-19: qty 50

## 2011-04-19 MED ORDER — LIDOCAINE HCL (PF) 1 % IJ SOLN
INTRAMUSCULAR | Status: AC
  Filled 2011-04-19: qty 60

## 2011-04-19 MED ORDER — SODIUM CHLORIDE 0.9 % IV SOLN
INTRAVENOUS | Status: DC
  Administered 2011-04-19: 08:00:00 via INTRAVENOUS

## 2011-04-19 MED ORDER — SODIUM CHLORIDE 0.9 % IJ SOLN: 3.0000 mL | INTRAMUSCULAR | Status: DC | PRN

## 2011-04-19 NOTE — Consult Note (Signed)
ELECTROPHYSIOLOGY CONSULT NOTE  Primary Care Physician: Ginette Otto, MD, MD Referring Physician:  Dr Eden Emms  Admit Date: 04/17/2011  Reason for consultation:  AV block  [name] Fields is a 78 y.o. female admitted with AV block.  She has a history of HTN and has been treated with Bisoprolol/HCTZ 2.5/6.25mg  q daily for "many years" presented to ED upon the advice of her PCP who performed a routine ECG today and found the patient to be in 2:1 AV block.  She reports mild fatigue but denies chest pain, shortness of breath, PND, orthopnea, dizziness, lightheadedness or syncope. Per the patient's son, she has a sedentary lifestyle. She normally is only able to walk around the house on a good day.  Her beta blockers have been held, however mobitz II AV block persists.  EP is therefore consultation for possible PPM implantation.   Past Medical History  Diagnosis Date  . Diabetes mellitus   . Hypertension    Past Surgical History  Procedure Date  . Cholecystectomy        . chlorhexidine   Topical Once  . chlorhexidine   Topical Once  . DULoxetine  30 mg Oral Daily  . enoxaparin  40 mg Subcutaneous Q24H  . gentamicin irrigation  80 mg Irrigation On Call  . hydrochlorothiazide  12.5 mg Oral Daily  . insulin aspart  0-9 Units Subcutaneous TID WC  . magnesium hydroxide  30 mL Oral Daily  . sodium chloride  3 mL Intravenous Q12H  . sodium polystyrene  15 g Oral Once  . DISCONTD: glipiZIDE  5 mg Oral Q breakfast      . sodium chloride 50 mL/hr at 04/19/11 0601    Allergies  Allergen Reactions  . Avelox (Moxifloxacin Hydrochloride)     Causes weakness  . Lyrica     Causes dizziness  . Micardis (Telmisartan)     Causes hyperkalemia  . Neurontin (Gabapentin)     Causes dizziness  . Oxycontin     Causes dizziness    History   Social History  . Marital Status: Widowed    Spouse Name: N/A    Number of Children: N/A  . Years of Education: N/A   Occupational History    . Not on file.   Social History Main Topics  . Smoking status: Not on file  . Smokeless tobacco: Not on file  . Alcohol Use:   . Drug Use:   . Sexually Active:    Other Topics Concern  . Not on file   Social History Narrative  . No narrative on file    ROS- All systems are reviewed and negative except as per the HPI above  Physical Exam: Telemetry: Filed Vitals:   04/19/11 0300 04/19/11 0400 04/19/11 0500 04/19/11 0600  BP:  160/40  158/40  Pulse: 48 49 49 46  Temp:  98.7 F (37.1 C)    TempSrc:  Oral    Resp: 20 23 16 22   Height:      Weight:   103 lb 9.9 oz (47 kg)   SpO2: 96% 96% 94% 95%    GEN- The patient is well appearing, alert and oriented x 3 today.   Head- normocephalic, atraumatic Eyes-  Sclera clear, conjunctiva pink Ears- hearing intact Oropharynx- clear Neck- supple, no JVP Lymph- no cervical lymphadenopathy Lungs- Clear to ausculation bilaterally, normal work of breathing Heart- Regular bradycardic rhythm, no murmurs, rubs or gallops, PMI not laterally displaced GI- soft, NT, ND, + BS Extremities-  no clubbing, cyanosis, or edema MS- no significant deformity or atrophy Skin- no rash or lesion Psych- euthymic mood, full affect Neuro- strength and sensation are intact  EKG today reveals mobitz II second degree AV block, QRS is of a LBBB morphology Echo reveals preserved EF with no significant structural abnormality.  Labs:   Lab Results  Component Value Date   WBC 7.8 04/18/2011   HGB 11.0* 04/18/2011   HCT 34.0* 04/18/2011   MCV 91.9 04/18/2011   PLT 228 04/18/2011    Lab 04/19/11 0511 04/17/11 1817  NA 138 --  K 5.0 --  CL 108 --  CO2 22 --  BUN 30* --  CREATININE 0.98 --  CALCIUM 9.4 --  PROT -- 7.3  BILITOT -- 0.2*  ALKPHOS -- 44  ALT -- 15  AST -- 20  GLUCOSE 98 --   Lab Results  Component Value Date   CKTOTAL 85 04/18/2011   CKMB 3.3 04/18/2011   TROPONINI <0.30 04/18/2011    Lab Results  Component Value Date   CHOL 150 04/18/2011    Lab Results  Component Value Date   HDL 45 04/18/2011   Lab Results  Component Value Date   LDLCALC 86 04/18/2011   Lab Results  Component Value Date   TRIG 93 04/18/2011   Lab Results  Component Value Date   CHOLHDL 3.3 04/18/2011   No results found for this basename: LDLDIRECT    ASSESSMENT AND PLAN:  The patient has symptomatic mobitz II second degree AV block.  This has persisted off of beta blocker therapy and is likely due to degenerative conduction disease. Risks, benefits, alternatives to pacemaker implantation were discussed in detail with the patient today. The patient understands that the risks include but are not limited to bleeding, infection, pneumothorax, perforation, tamponade, vascular damage, renal failure, MI, stroke, death,  and lead dislodgement.  She understands that her advanced age increases her risks.  I have offered her palliative measures as an alternative.  At this time, she wishes to proceed with PPM implantation. We will therefore proceed with PPM later today.     Hillis Range, MD 04/19/2011  7:42 AM

## 2011-04-19 NOTE — Op Note (Signed)
SURGEON:  Hillis Range, MD     PREPROCEDURE DIAGNOSIS:  Mobitz II second degree AV block  POSTPROCEDURE DIAGNOSIS:  Mobitz II second degree AV block     PROCEDURES:   1. Left upper extremity venography.   2. Pacemaker implantation.     INTRODUCTION: [name] Fields is a 78 y.o. female  with a history of Mobitz II second degree AV block who presents today for pacemaker implantation.  No reversible causes have been identified.  The patient therefore presents today for pacemaker implantation.     DESCRIPTION OF PROCEDURE:  Informed written consent was obtained, and the patient was brought to the electrophysiology lab in a fasting state.  The patient required no sedation for the procedure today.  The patients left chest was prepped and draped in the usual sterile fashion by the EP lab staff. The skin overlying the left deltopectoral region was infiltrated with lidocaine for local analgesia.  A 4-cm incision was made over the left deltopectoral region.  A left subcutaneous pacemaker pocket was fashioned using a combination of sharp and blunt dissection. Electrocautery was required to assure hemostasis.    Left Upper Extremity Venography: A venogram of the left upper extremity was performed, which revealed a small left cephalic vein, which emptied into a small left subclavian vein.  The left axillary vein was small in size.    RA/RV Lead Placement: The left axilllary vein was therefore cannulated with fluoroscopic visualization.  Through the left axillary vein, a [facility] model [phone] (serial number  [id number]) right atrial lead and a [facility] model 1948- 58 (serial number  [id number]) right ventricular lead were advanced with fluoroscopic visualization into the right atrial appendage and right ventricular apex positions respectively.  Initial atrial lead P- waves measured 3.18mV with impedance of 484 ohms and a threshold of 1.5 V at 0.5 msec.  Right ventricular lead R-waves measured 11mV  with an impedance of 909 ohms and a threshold of 1 V at 0.5 msec.  Both leads were secured to the pectoralis fascia using #2-0 silk over the suture sleeves.   Device Placement:  The leads were then connected to a [employer] XL DR model [id number] (serial number  [id number] ) pacemaker.  The pocket was irrigated with copious gentamicin solution.  The pacemaker was then placed into the pocket.  The pocket was then closed in 2 layers with 2.0 Vicryl suture for the subcutaneous and subcuticular layers.  Steri- Strips and a sterile dressing were then applied.  There were no early apparent complications.     CONCLUSIONS:   1. Successful implantation of a [employer] Zephyr XL DR dual-chamber pacemaker for mobitz II second degree AV block  2. No early apparent complications.           Hillis Range, MD 04/19/2011 1:18 PM

## 2011-04-19 NOTE — Progress Notes (Signed)
Discussed at great length with patient and family about pacemaker and plan of care. Pt states she is "concerned" about the risks but is willing to have pacer placed. Risks and benefits discussed and all verbalize understanding of procedure and risk/benefits. Dr Johney Frame paged and is at bedside to discuss with pt and family about procedure. All deny further questions and states she would like to have pacer placed. Pt transported to cath lab with RN and monitor. All belongings, including hearing aide, with pt's daughter. Pt currently has the set of dentures in when the patient was transported to the cath lab.

## 2011-04-19 NOTE — Brief Op Note (Signed)
04/17/2011 - 04/19/2011  1:17 PM  PATIENT:  [name] Fields  78 y.o. female  PRE-OPERATIVE DIAGNOSIS: Mobitz II second degree AV block  POST-OPERATIVE DIAGNOSIS:  Mobitz II second degree AV block  PROCEDURE:  Procedure(s):  Venography of the left upper extremity, PERMANENT PACEMAKER INSERTION  SURGEON:  Surgeon(s): Gardiner Rhyme, MD  PHYSICIAN ASSISTANT:   ASSISTANTS: none   ANESTHESIA:   none  EBL:  Total I/O In: 400 [I.V.:400] Out: 275 [Urine:275]  BLOOD ADMINISTERED:none  DRAINS: none   LOCAL MEDICATIONS USED:  LIDOCAINE 5 CC  SPECIMEN:  No Specimen  DISPOSITION OF SPECIMEN:  N/A  COUNTS:  YES  TOURNIQUET:  * No tourniquets in log *  DICTATION: .Note written in EPIC  PLAN OF CARE: Admit to inpatient   PATIENT DISPOSITION:  PACU - hemodynamically stable.   Delay start of Pharmacological VTE agent (>24hrs) due to surgical blood loss or risk of bleeding:  {YES/NO/NOT APPLICABLE:20182

## 2011-04-20 ENCOUNTER — Inpatient Hospital Stay (HOSPITAL_COMMUNITY): Payer: Medicare Other

## 2011-04-20 ENCOUNTER — Other Ambulatory Visit: Payer: Self-pay

## 2011-04-20 DIAGNOSIS — I441 Atrioventricular block, second degree: Secondary | ICD-10-CM

## 2011-04-20 LAB — GLUCOSE, CAPILLARY
Glucose-Capillary: 300 mg/dL — ABNORMAL HIGH (ref 70–99)
Glucose-Capillary: 262 mg/dL — ABNORMAL HIGH (ref 70–99)
Glucose-Capillary: 164 mg/dL — ABNORMAL HIGH (ref 70–99)

## 2011-04-20 NOTE — Progress Notes (Signed)
   CARE MANAGEMENT NOTE 04/20/2011  Patient:  [name] [name], [name] [name]   Account Number:  [id number]  Date Initiated:  04/20/2011  Documentation initiated by:  GRAVES-BIGELOW,Rane Blitch  Subjective/Objective Assessment:   Pt admitted with bradycardia-complete heart block. She is from home with her son [name] [phone].     Action/Plan:   CM discussed with RN plans of care and if pt was wanting snf. CM was leaning more towards home with PT safety eval/ RN. MD wrote for PT consult for disposition. CM discussed with son about options and that CM will f/u after PT consultation.   Anticipated DC Date:  04/20/2011   Anticipated DC Plan:  HOME W HOME HEALTH SERVICES      DC Planning Services  CM consult      Choice offered to / List presented to:             Status of service:  In process, will continue to follow Medicare Important Message given?   (If response is "NO", the following Medicare IM given date fields will be blank) Date Medicare IM given:   Date Additional Medicare IM given:    Discharge Disposition:    Per UR Regulation:    Comments:  04-20-11 [address] Tomi Bamberger, RN,BSN [phone] Family discussed if able to go home would like [facility] RN, AIDE and PT. List of agencies was given to pt. Will conitnue to monitor.

## 2011-04-20 NOTE — Discharge Summary (Signed)
ELECTROPHYSIOLOGY PROCEDURE DISCHARGE SUMMARY    Patient ID: [name] [name],  MRN: [id number], DOB/AGE: 01/10/1911 78 y.o.  Admit date: 04/17/2011 Discharge date: 04/21/2011  Primary Care Physician: Merlene Laughter, MD Primary Cardiologist: Hillis Range, MD (new this admission)  Primary Discharge Diagnosis:  2:1 heart block status post pacemaker implant this admission  Secondary Discharge Diagnosis:  1.  Diabetes 2.  Hypertension  Procedures This Admission: 1.  Implantation of a dual chamber pacemaker on 04-19-2011 by Dr Johney Frame.  The patient received a [facility] pacemaker model number [phone] with model number 2088 right atrial lead and model number 1948 right ventricular lead.  The patient had no early apparent complications. 2.  Chest x-ray on 04-20-2011 demonstrated no pneumothorax status post device implantation.   Brief HPI: [name] [name] is a 78 year old female with a history of hypertension and diabetes who has been treated for many years with Bisoprolol/HCTZ.  She was evaluated at her primary care physician's office and was found to be in 2:1 heart block.  Recommendations were that she come to [facility] for further evaluation.  Hospital Course:  The patient was admitted on 04-17-2011 for 2:1 heart block.  Her Bisoprolol was held.  She had persistent heart block despite discontinuation of her beta blocker.  She was evaluated by Dr Johney Frame with electrophysiology who recommended pacemaker implantation.  Risks, benefits, and alternatives to device implantation were reviewed with the patient and she wished to proceed.  She underwent implantation of a [employer] pacemaker on 04-19-2011 by Dr Johney Frame with details as outlined above.  She was monitored on telemetry overnight which demonstrated sinus rhythm with ventricular pacing and occasional A/V pacing.  Her left chest was without hematoma or ecchymosis.  CXR was obtained which demonstrated no pneumothorax status post device  implantation.  Her device was interrogated and found to be functioning normally.  Dr Johney Frame examined the patient on 04-20-2011 and considered her stable for discharge to home with routine follow up and on her home medications.   Before discharge on 04-20-2011, there were concerns about safety of patient returning to home because of weakness.  A PT consult was ordered and SNF placement was recommended.  The patient and family were agreeable to that and plans are to discharge to SNF on 04-21-2011 per Dr Johney Frame.  Discharge Vitals: Blood pressure 151/56, pulse 60, temperature 99 F (37.2 C), temperature source Oral, resp. rate 20, height 4\' 11"  (1.499 m), weight 100 lb 15.5 oz (45.8 kg), SpO2 94.00%.    Labs:   Lab Results  Component Value Date   WBC 7.8 04/18/2011   HGB 11.0* 04/18/2011   HCT 34.0* 04/18/2011   MCV 91.9 04/18/2011   PLT 228 04/18/2011     Lab 04/19/11 0511 04/17/11 1817  NA 138 --  K 5.0 --  CL 108 --  CO2 22 --  BUN 30* --  CREATININE 0.98 --  CALCIUM 9.4 --  PROT -- 7.3  BILITOT -- 0.2*  ALKPHOS -- 44  ALT -- 15  AST -- 20  GLUCOSE 98 --   Lab Results  Component Value Date   CKTOTAL 85 04/18/2011   CKMB 3.3 04/18/2011   TROPONINI <0.30 04/18/2011    Lab Results  Component Value Date   CHOL 150 04/18/2011   Lab Results  Component Value Date   HDL 45 04/18/2011   Lab Results  Component Value Date   LDLCALC 86 04/18/2011   Lab Results  Component Value Date   TRIG 93 04/18/2011   Lab Results  Component Value Date   CHOLHDL 3.3 04/18/2011    Discharge Medications: Current Discharge Medication List    CONTINUE these medications which have NOT CHANGED   Details  acetaminophen (TYLENOL) 500 MG tablet Take 500 mg by mouth once as needed. For pain.     bisoprolol-hydrochlorothiazide (ZIAC) 2.5-6.25 MG per tablet Take 1 tablet by mouth daily.      DULoxetine (CYMBALTA) 30 MG capsule Take 30 mg by mouth daily.      glipiZIDE (GLUCOTROL XL) 5 MG 24 hr tablet Take 5 mg by  mouth daily.      magnesium hydroxide (MILK OF MAGNESIA) 400 MG/5ML suspension Take 30 mLs by mouth daily.      polyethylene glycol powder (GLYCOLAX/MIRALAX) powder Take 17 g by mouth once as needed.      sennosides-docusate sodium (SENOKOT-S) 8.6-50 MG tablet Take 1 tablet by mouth daily as needed. For constipation     traMADol (ULTRAM) 50 MG tablet Take 50 mg by mouth every 8 (eight) hours as needed. For pain. Maximum dose= 8 tablets per day         Disposition:  Discharge Orders    Future Appointments: Provider: Department: Dept Phone: Center:   05/01/2011 12:00 PM Vella Kohler [facility] [phone] [facility]     Future Orders Please Complete By Expires   Ambulatory referral to Home Health      Comments:   Please evaluate [name] [name] for admission to [facility].  Disciplines requested: Nursing and Physical Therapy  Services to provide: Strengthening Exercises, Evaluate and Other: Disease management  Physician to follow patient's care (the person listed here will be responsible for signing ongoing orders): PCP  Requested Start of Care Date: Tomorrow  Special Instructions:  Please assist patient with disease management and that she understands/complies with discharge instructions. She will need PT evaluation and strengthening exercises. Also, she is to follow-up limitations of certain movements outlined in the discharge instructions.   Diet - low sodium heart healthy      Increase activity slowly      Comments:   Please see attached sheet for instructions on wound care, activity, and bathing.       Follow-up Information    Follow up with [facility]. (Wound check at 05/01/11 at 12pm)    Contact information:   [address] [place] [phone]          Duration of Discharge Encounter: Greater than 30 minutes including physician time.  Signed, Gypsy Balsam, RN, BSN 04/21/2011, 12:48 PM  I have seen, examined  the patient, and reviewed the above discharge assessment and plan.   Co Sign: Hillis Range, MD 04/21/2011 12:55 PM

## 2011-04-20 NOTE — Plan of Care (Signed)
Problem: Phase II Progression Outcomes Goal: Hemodynamically stable Outcome: Progressing Blood pressures running in the 170's systolic.

## 2011-04-20 NOTE — Progress Notes (Signed)
Physical Therapy Evaluation Patient Details Name: [name] [name] MRN: [id number] DOB: [birth year] Today's Date: 04/20/2011  Problem List:  Patient Active Problem List  Diagnoses  . AV block, Mobitz 2  . Second degree Mobitz II AV block    Past Medical History:  Past Medical History  Diagnosis Date  . Diabetes mellitus   . Hypertension   . Mobitz II   . Arthritis   . Gout attack 03/2011    "for the first time; right wrist"   Past Surgical History:  Past Surgical History  Procedure Date  . Cholecystectomy   . Insert / replace / remove pacemaker 04/19/11    initial placement  . Back surgery     "have had 3 diskectomies"  . Cataract extraction, bilateral     PT Assessment/Plan/Recommendation PT Assessment Clinical Impression Statement: Patient is s/p pacemaker placement with decr mobility secondary to deconditioning.  Son reports pt. is weaker and he cannot provide assist for her that she needs due to his back pain.  Pt. and son desire NHP for therapy.  CM notified.   PT Recommendation/Assessment: Patient will need skilled PT in the acute care venue PT Problem List: Decreased activity tolerance;Decreased balance;Decreased mobility;Decreased knowledge of use of DME;Decreased safety awareness;Decreased knowledge of precautions Barriers to Discharge: Decreased caregiver support PT Therapy Diagnosis : Generalized weakness PT Plan PT Frequency: Min 3X/week PT Treatment/Interventions: DME instruction;Gait training;Functional mobility training;Therapeutic activities;Therapeutic exercise;Balance training;Patient/family education PT Recommendation Follow Up Recommendations: Skilled nursing facility;24 hour supervision/assistance Equipment Recommended: Defer to next venue PT Goals  Acute Rehab PT Goals PT Goal Formulation: With patient Time For Goal Achievement: 2 weeks Pt will go Supine/Side to Sit: with modified independence PT Goal: Supine/Side to Sit - Progress: Other  (comment) Pt will go Sit to Stand: with supervision;with cues (comment type and amount) PT Goal: Sit to Stand - Progress: Other (comment) Pt will Ambulate: 16 - 50 feet;with supervision;with least restrictive assistive device PT Goal: Ambulate - Progress: Other (comment)  PT Evaluation Precautions/Restrictions  Precautions Precautions: Fall Required Braces or Orthoses: No Restrictions LUE Weight Bearing:  (limited movement secondary to pacemaker  ) Prior Functioning  Home Living Lives With: [name] [name] Help From: Family Type of Home: House Home Layout: One level Home Access: Ramped entrance Bathroom Shower/Tub: Health visitor: Standard Bathroom Accessibility: Yes How Accessible: Accessible via walker Home Adaptive Equipment: Dan Humphreys - four wheeled;Bedside commode/3-in-1;Tub transfer bench Prior Function Level of Independence: Independent with basic ADLs;Independent with gait;Needs assistance with homemaking Meal Prep: Supervision/set-up Light Housekeeping: Moderate Driving: No Vocation: Retired Producer, television/film/video: Awake/alert Overall Cognitive Status: Appears within functional limits for tasks assessed Orientation Level: Oriented X4 Sensation/Coordination Sensation Light Touch: Appears Intact Stereognosis: Not tested Hot/Cold: Not tested Proprioception: Not tested Coordination Gross Motor Movements are Fluid and Coordinated: Yes Fine Motor Movements are Fluid and Coordinated: Yes Extremity Assessment RUE Assessment RUE Assessment: Within Functional Limits LUE Assessment LUE Assessment: Not tested (pacemaker precautions) RLE Assessment RLE Assessment: Within Functional Limits LLE Assessment LLE Assessment: Within Functional Limits Mobility (including Balance) Bed Mobility Bed Mobility: No Transfers Transfers: Yes Sit to Stand: 3: Mod assist;With upper extremity assist;From chair/3-in-1 Sit to Stand Details (indicate cue type  and reason): Needed mod assist as pt. had difficulty with anterior translation of pelvis secondary to kyphotic posture decreasing her postural control overall.  Cued pt. to decr use of left UE. Stand to Sit: 3: Mod assist;With upper extremity assist;With armrests;To chair/3-in-1 Stand to Sit Details: Assist to control descent  secondary to pt with decr postural control Ambulation/Gait Ambulation/Gait: Yes Ambulation/Gait Assistance: 4: Min assist Ambulation/Gait Assistance Details (indicate cue type and reason): Pt. with very forward flexed posture secondary to kyphosis and forward head.  Son present and states that pt. "bent over" with ambulation prior to admit but is worse now than before.  Pt. also now with difficulty stepping feet appropriately thus losing balance and needing steadying assist at times. (Ambulated without RW with mod assist and HHA) Ambulation Distance (Feet): 45 Feet Assistive device: Rolling walker Gait Pattern: Step-to pattern;Decreased stride length;Decreased step length - right;Decreased hip/knee flexion - right;Decreased hip/knee flexion - left;Shuffle;Trunk flexed Gait velocity: Slow cadence Stairs: No Wheelchair Mobility Wheelchair Mobility: No  Posture/Postural Control Posture/Postural Control: Postural limitations Postural Limitations: Flexed posture with significant thoracic kyphosis Exercise    End of Session PT - End of Session Equipment Utilized During Treatment: Gait belt Activity Tolerance: Patient tolerated treatment well Patient left: in chair;with call bell in reach;with family/visitor present Nurse Communication: Mobility status for transfers;Mobility status for ambulation General Behavior During Session: [facility] for tasks performed Cognition: [facility] for tasks performed  [name] [name] 04/20/2011, 12:53 PM [facility] [phone] (pager)

## 2011-04-20 NOTE — Progress Notes (Addendum)
Clinical Social Worker was referred by CM due to recommendation for SNF by PT. CSW will complete FL2 for MD's signature. Patient's insurance company requires pre-authorization before transfer to SNF. Due to the pre-authorization and the late PT note, discharge to SNF cannot be facilitated today until her insurance gives authorization. CSW is awaiting authorization from insurance and bed availability.   [name] [name] MSW, [employer] [phone]

## 2011-04-20 NOTE — Progress Notes (Addendum)
   ELECTROPHYSIOLOGY ROUNDING NOTE    Patient Name: [name] [name] Date of Encounter: 04-20-2011    SUBJECTIVE:status post pacemaker implantation 04-19-2011 for heart block.  TELEMETRY: Reviewed telemetry pt in sinus rhythm with V pacing/ A/V pacing Filed Vitals:   04/19/11 1405 04/19/11 1951 04/19/11 2340 04/20/11 0521  BP: 219/69 166/73 167/65 150/71  Pulse: 59 67 61 60  Temp: 97.6 F (36.4 C) 98.5 F (36.9 C) 98.5 F (36.9 C) 98.9 F (37.2 C)  TempSrc: Oral Oral Oral Oral  Resp: 19 23 25 16   Height:      Weight:   104 lb 4.4 oz (47.3 kg)   SpO2: 95% 95% 97% 97%    Radiology/Studies:  Final result pending, leads in stable position.  PHYSICAL EXAM Left chest without hematoma or ecchymosis.  DEVICE INTERROGATION: Device interrogated by industry.  Lead values including impedence, sensing, threshold within normal values.    Active Problems:  AV block, Mobitz 2  Second degree Mobitz II AV block   Discharge plans per Dr Johney Frame.  Signed, Gypsy Balsam RN, BSN 04/20/2011 7:42 AM  Doing well this am, no concern  Physical Exam: Filed Vitals:   04/19/11 1405 04/19/11 1951 04/19/11 2340 04/20/11 0521  BP: 219/69 166/73 167/65 150/71  Pulse: 59 67 61 60  Temp: 97.6 F (36.4 C) 98.5 F (36.9 C) 98.5 F (36.9 C) 98.9 F (37.2 C)  TempSrc: Oral Oral Oral Oral  Resp: 19 23 25 16   Height:      Weight:   104 lb 4.4 oz (47.3 kg)   SpO2: 95% 95% 97% 97%    GEN- The patient is well appearing, alert  Head- normocephalic, atraumatic Eyes-  Sclera clear, conjunctiva pink Ears- hearing intact Oropharynx- clear Neck- supple, no JVP Lungs- Clear to ausculation bilaterally, normal work of breathing Chest- pacemaker pocket without hematoma or concerns Heart- Regular rate and rhythm,   GI- soft, NT, ND, + BS Extremities- no clubbing, cyanosis, or edema  Pacemaker interrogation- reviewed in detail today,  See PACEART report  Assessment and Plan:  Mobitz II second degree  AV block- doing well s/p PPM  CXR without obvious ptx,  Interrogation reviewed and normal  HTN- remains above goal but improved,  Restart home medications and follow  Routine wound check and follow-up

## 2011-04-20 NOTE — Progress Notes (Signed)
   CARE MANAGEMENT NOTE 04/20/2011  Patient:  [name] [name], [name] [name]   Account Number:  [id number]  Date Initiated:  04/20/2011  Documentation initiated by:  GRAVES-BIGELOW,Nayomi Tabron  Subjective/Objective Assessment:   Pt admitted with bradycardia-complete heart block. She is from home with her son [name] [phone].     Action/Plan:   CM discussed with RN plans of care and if pt was wanting snf. CM was leaning more towards home with PT safety eval/ RN. MD wrote for PT consult for disposition. CM discussed with son about options and that CM will f/u after PT consultation.   Anticipated DC Date:  04/20/2011   Anticipated DC Plan:  HOME W HOME HEALTH SERVICES  In-house referral  Clinical Social Worker      DC Planning Services  CM consult      Choice offered to / List presented to:             Status of service:  Completed, signed off Medicare Important Message given?   (If response is "NO", the following Medicare IM given date fields will be blank) Date Medicare IM given:   Date Additional Medicare IM given:    Discharge Disposition:  SKILLED NURSING FACILITY  Per UR Regulation:    Comments:  04-20-11 1213 Tomi Bamberger, RN, BSN [phone] CM spoke to PT and stated that recommnedations are for SNF. Pt and son are agreeable and CM relayed information to [place]. CM will sign off for now.  04-20-11 1125 Tomi Bamberger, [place] [phone] Family discussed if able to go home would like [facility] RN, AIDE and PT. List of agencies was given to pt. Will conitnue to monitor.

## 2011-04-21 LAB — GLUCOSE, CAPILLARY: Glucose-Capillary: 123 mg/dL — ABNORMAL HIGH (ref 70–99)

## 2011-04-21 NOTE — Progress Notes (Signed)
   ELECTROPHYSIOLOGY ROUNDING NOTE    Patient Name: [name] Fields Date of Encounter: 04-21-2011    SUBJECTIVE:No chest pain or shortness of breath.  Very weak yesterday walking.  She is willing to go to SNF.  Status post pacemaker implant 04-19-2011.  TELEMETRY: Reviewed telemetry pt in AV pacing with occasional SR and V pacing  Physical Exam: Filed Vitals:   04/20/11 1723 04/20/11 2100 04/21/11 0642 04/21/11 1101  BP: 186/70 165/79 141/54 151/56  Pulse: 60 60 62 60  Temp: 98.7 F (37.1 C) 98.7 F (37.1 C) 99 F (37.2 C)   TempSrc: Oral Oral    Resp: 16 20 20    Height: 4\' 11"  (1.499 m)     Weight: 91 lb 14.9 oz (41.7 kg)  100 lb 15.5 oz (45.8 kg)   SpO2: 100% 97% 94%     GEN- The patient is well appearing, alert and oriented x 3 today.   Head- normocephalic, atraumatic Eyes-  Sclera clear, conjunctiva pink Ears- hearing intact Oropharynx- clear Neck- supple, no JVP Lymph- no cervical lymphadenopathy Lungs- Clear to ausculation bilaterally, normal work of breathing Chest- pacemaker pocket is without hematoma Heart- Regular rate and rhythm, no murmurs, rubs or gallops, PMI not laterally displaced GI- soft, NT, ND, + BS Extremities- no clubbing, cyanosis, or edema  Assessment and Plan:  Doing well s/p PPM for CHB Routine wound care and follow-up  BP stable  Dietrich Ke,MD 12:54 PM 04/21/2011

## 2011-04-21 NOTE — Progress Notes (Signed)
Clinical Social Worker received authorization from [employer] and patient and family choose [name], [place]. CSW facilitated patient discharge to SNF by contacting family and facility and family agreeable to transport patient to SNF today.  [name] MSW, [employer] [phone]

## 2011-04-21 NOTE — Progress Notes (Signed)
04/21/11 Nursing 1417 DC Tele, DC IV, DC to SNF. Discharge instructions and home medications discussed with patient and patient's family. Patient denies any questions or concerns at this time. Patient leaving unit via wheelchair and appears in no acute distress. Ernesta Amble, RN

## 2011-05-01 ENCOUNTER — Encounter: Payer: Self-pay | Admitting: Internal Medicine

## 2011-05-01 ENCOUNTER — Ambulatory Visit (INDEPENDENT_AMBULATORY_CARE_PROVIDER_SITE_OTHER): Payer: Medicare Other | Admitting: *Deleted

## 2011-05-01 DIAGNOSIS — I441 Atrioventricular block, second degree: Secondary | ICD-10-CM

## 2011-05-01 LAB — PACEMAKER DEVICE OBSERVATION
AL IMPEDENCE PM: 426 Ohm
ATRIAL PACING PM: 81
BATTERY VOLTAGE: 2.81 V
RV LEAD IMPEDENCE PM: 798 Ohm
VENTRICULAR PACING PM: 90

## 2011-05-01 NOTE — Progress Notes (Signed)
Wound check-PPM 

## 2011-05-02 ENCOUNTER — Telehealth: Payer: Self-pay | Admitting: Internal Medicine

## 2011-05-02 NOTE — Telephone Encounter (Signed)
lmom that she can use her walker and not to lift anything over 10 pounds for the next month

## 2011-05-02 NOTE — Telephone Encounter (Signed)
Needs to know if she is weight baring on her left arm leave message if no answer

## 2011-07-27 ENCOUNTER — Encounter: Payer: Self-pay | Admitting: Internal Medicine

## 2011-07-27 ENCOUNTER — Ambulatory Visit (INDEPENDENT_AMBULATORY_CARE_PROVIDER_SITE_OTHER): Payer: Medicare Other | Admitting: Internal Medicine

## 2011-07-27 VITALS — BP 138/68 | HR 66 | Resp 18 | Ht 59.0 in | Wt 101.4 lb

## 2011-07-27 DIAGNOSIS — I441 Atrioventricular block, second degree: Secondary | ICD-10-CM

## 2011-07-27 LAB — PACEMAKER DEVICE OBSERVATION
AL THRESHOLD: 0.5 V
ATRIAL PACING PM: 68
BAMS-0001: 150 {beats}/min
BAMS-0003: 70 {beats}/min
DEVICE MODEL PM: 7278688
RV LEAD IMPEDENCE PM: 745 Ohm
RV LEAD THRESHOLD: 0.625 V

## 2011-07-27 NOTE — Assessment & Plan Note (Signed)
Normal pacemaker function See Pace Art report No changes today  Return 1/14 

## 2011-07-27 NOTE — Progress Notes (Signed)
PCP:  Ginette Otto, MD, MD  The patient presents today for routine electrophysiology followup.  Since her recent PPM implant, the patient reports doing very well.  Today, she denies symptoms of palpitations, chest pain, shortness of breath,  lower extremity edema, dizziness, presyncope, or syncope.  The patient feels that she is tolerating medications without difficulties and is otherwise without complaint today.   Past Medical History  Diagnosis Date  . Diabetes mellitus   . Hypertension   . Second degree Mobitz II AV block     s/p PPM  . Arthritis   . Gout attack 03/2011    "for the first time; right wrist"   Past Surgical History  Procedure Date  . Cholecystectomy   . Pacemaker insertion 04/19/11    SJM Zephyr XL implanted by Dr Johney Frame for mobitz II second degree AV block  . Back surgery     "have had 3 diskectomies"  . Cataract extraction, bilateral     Current Outpatient Prescriptions  Medication Sig Dispense Refill  . acetaminophen (TYLENOL) 500 MG tablet Take 500 mg by mouth once as needed. For pain.       . bisoprolol-hydrochlorothiazide (ZIAC) 2.5-6.25 MG per tablet Take 1 tablet by mouth daily.        . DULoxetine (CYMBALTA) 30 MG capsule Take 30 mg by mouth daily.        Marland Kitchen glipiZIDE (GLUCOTROL XL) 5 MG 24 hr tablet Take 5 mg by mouth daily.        . magnesium hydroxide (MILK OF MAGNESIA) 400 MG/5ML suspension Take 30 mLs by mouth daily.        . sennosides-docusate sodium (SENOKOT-S) 8.6-50 MG tablet Take 1 tablet by mouth daily as needed. For constipation       . traMADol (ULTRAM) 50 MG tablet Take 50 mg by mouth every 8 (eight) hours as needed. For pain. Maximum dose= 8 tablets per day       . polyethylene glycol powder (GLYCOLAX/MIRALAX) powder Take 17 g by mouth once as needed.          Allergies  Allergen Reactions  . Avelox (Moxifloxacin Hydrochloride)     Causes weakness  . Lyrica     Causes dizziness  . Micardis (Telmisartan)     Causes hyperkalemia   . Neurontin (Gabapentin)     Causes dizziness  . Oxycontin     Causes dizziness    History   Social History  . Marital Status: Widowed    Spouse Name: N/A    Number of Children: N/A  . Years of Education: N/A   Occupational History  . Not on file.   Social History Main Topics  . Smoking status: Never Smoker   . Smokeless tobacco: Never Used  . Alcohol Use: No  . Drug Use: No  . Sexually Active: No   Other Topics Concern  . Not on file   Social History Narrative  . No narrative on file    No family history on file.  ROS-  All systems are reviewed and are negative except as outlined in the HPI above   Physical Exam: Filed Vitals:   07/27/11 1008  BP: 138/68  Pulse: 66  Resp: 18  Height: 4\' 11"  (1.499 m)  Weight: 101 lb 6.4 oz (45.995 kg)    GEN- The patient is elderly but well appearing, alert and oriented x 3 today.   Head- normocephalic, atraumatic Eyes-  Sclera clear, conjunctiva pink Ears- hearing intact Oropharynx- clear  Lungs- Clear to ausculation bilaterally, normal work of breathing Chest- pacemaker pocket is well healed Heart- Regular rate and rhythm, no murmurs, rubs or gallops, PMI not laterally displaced GI- soft, NT, ND, + BS Extremities- no clubbing, cyanosis, or edema  Pacemaker interrogation- reviewed in detail today,  See PACEART report  Assessment and Plan:

## 2011-07-27 NOTE — Patient Instructions (Signed)
Your physician wants you to follow-up in: Jan 2013 You will receive a reminder letter in the mail two months in advance. If you don't receive a letter, please call our office to schedule the follow-up appointment.  

## 2012-04-22 ENCOUNTER — Encounter: Payer: Medicare Other | Admitting: Internal Medicine

## 2012-05-03 ENCOUNTER — Ambulatory Visit (INDEPENDENT_AMBULATORY_CARE_PROVIDER_SITE_OTHER): Payer: Medicare Other | Admitting: Internal Medicine

## 2012-05-03 ENCOUNTER — Encounter: Payer: Self-pay | Admitting: Internal Medicine

## 2012-05-03 VITALS — BP 173/60 | HR 63 | Ht 59.0 in | Wt 113.8 lb

## 2012-05-03 DIAGNOSIS — I441 Atrioventricular block, second degree: Secondary | ICD-10-CM

## 2012-05-03 LAB — PACEMAKER DEVICE OBSERVATION
AL IMPEDENCE PM: 396 Ohm
ATRIAL PACING PM: 87
RV LEAD IMPEDENCE PM: 681 Ohm
RV LEAD THRESHOLD: 0.5 V

## 2012-05-03 NOTE — Patient Instructions (Addendum)
Your physician wants you to follow-up in: 12 months with Dr Allred You will receive a reminder letter in the mail two months in advance. If you don't receive a letter, please call our office to schedule the follow-up appointment.  

## 2012-05-03 NOTE — Progress Notes (Signed)
PCP:  Ginette Otto, MD  The patient presents today for routine electrophysiology followup.  Since her last visit, the patient reports doing very well.  Today, she denies symptoms of palpitations, chest pain, shortness of breath,  lower extremity edema, dizziness, presyncope, or syncope.  The patient feels that she is tolerating medications without difficulties and is otherwise without complaint today.   Past Medical History  Diagnosis Date  . Diabetes mellitus   . Hypertension   . Second degree Mobitz II AV block     s/p PPM  . Arthritis   . Gout attack 03/2011    "for the first time; right wrist"   Past Surgical History  Procedure Date  . Cholecystectomy   . Pacemaker insertion 04/19/11    SJM Zephyr XL implanted by Dr Johney Frame for mobitz II second degree AV block  . Back surgery     "have had 3 diskectomies"  . Cataract extraction, bilateral     Current Outpatient Prescriptions  Medication Sig Dispense Refill  . acetaminophen (TYLENOL) 500 MG tablet Take 500 mg by mouth once as needed. For pain.       . bisoprolol-hydrochlorothiazide (ZIAC) 2.5-6.25 MG per tablet Take 1 tablet by mouth daily.        Marland Kitchen glipiZIDE (GLUCOTROL XL) 5 MG 24 hr tablet Take 5 mg by mouth daily.        . magnesium hydroxide (MILK OF MAGNESIA) 400 MG/5ML suspension Take 30 mLs by mouth daily.        . sennosides-docusate sodium (SENOKOT-S) 8.6-50 MG tablet Take 1 tablet by mouth daily as needed. For constipation       . traMADol (ULTRAM) 50 MG tablet Take 50 mg by mouth every 8 (eight) hours as needed. For pain. Maximum dose= 8 tablets per day       . TRAVATAN Z 0.004 % SOLN ophthalmic solution         Allergies  Allergen Reactions  . Avelox (Moxifloxacin Hydrochloride)     Causes weakness  . Micardis (Telmisartan)     Causes hyperkalemia  . Neurontin (Gabapentin)     Causes dizziness  . Oxycodone Hcl Er     Causes dizziness  . Pregabalin     Causes dizziness    History   Social History    . Marital Status: Widowed    Spouse Name: N/A    Number of Children: N/A  . Years of Education: N/A   Occupational History  . Not on file.   Social History Main Topics  . Smoking status: Never Smoker   . Smokeless tobacco: Never Used  . Alcohol Use: No  . Drug Use: No  . Sexually Active: No   Other Topics Concern  . Not on file   Social History Narrative  . No narrative on file    No family history on file.  ROS-  All systems are reviewed and are negative except as outlined in the HPI above   Physical Exam: Filed Vitals:   05/03/12 1134  BP: 173/60  Pulse: 63  Height: 4\' 11"  (1.499 m)  Weight: 113 lb 12.8 oz (51.619 kg)    GEN- The patient is elderly but well appearing, alert and oriented x 3 today.   Head- normocephalic, atraumatic Eyes-  Sclera clear, conjunctiva pink Ears- hearing intact Oropharynx- clear Lungs- Clear to ausculation bilaterally, normal work of breathing Chest- pacemaker pocket is well healed Heart- Regular rate and rhythm, no murmurs, rubs or gallops, PMI not laterally  displaced GI- soft, NT, ND, + BS Extremities- no clubbing, cyanosis, or edema  Pacemaker interrogation- reviewed in detail today,  See PACEART report  Assessment and Plan:   Second degree Mobitz II AV block   Normal pacemaker function  See Pace Art report  No changes today  HTN Above goal She has not taken her medicine today. She will take her home medicines and keep a log of her BP for Dr Pete Glatter.  Return to the device clinic in 1 year

## 2012-05-16 ENCOUNTER — Encounter: Payer: Self-pay | Admitting: Internal Medicine

## 2012-08-03 ENCOUNTER — Encounter (HOSPITAL_COMMUNITY): Payer: Self-pay | Admitting: *Deleted

## 2012-08-03 ENCOUNTER — Emergency Department (HOSPITAL_COMMUNITY)
Admission: EM | Admit: 2012-08-03 | Discharge: 2012-08-03 | Disposition: A | Discharge status: Home / Self Care | Payer: Medicare Other | Attending: Emergency Medicine | Admitting: Emergency Medicine

## 2012-08-03 DIAGNOSIS — E119 Type 2 diabetes mellitus without complications: Secondary | ICD-10-CM | POA: Insufficient documentation

## 2012-08-03 DIAGNOSIS — Z8739 Personal history of other diseases of the musculoskeletal system and connective tissue: Secondary | ICD-10-CM | POA: Insufficient documentation

## 2012-08-03 DIAGNOSIS — I10 Essential (primary) hypertension: Secondary | ICD-10-CM | POA: Insufficient documentation

## 2012-08-03 DIAGNOSIS — Z8679 Personal history of other diseases of the circulatory system: Secondary | ICD-10-CM | POA: Insufficient documentation

## 2012-08-03 DIAGNOSIS — Z79899 Other long term (current) drug therapy: Secondary | ICD-10-CM | POA: Insufficient documentation

## 2012-08-03 DIAGNOSIS — Z8639 Personal history of other endocrine, nutritional and metabolic disease: Secondary | ICD-10-CM | POA: Insufficient documentation

## 2012-08-03 DIAGNOSIS — Z862 Personal history of diseases of the blood and blood-forming organs and certain disorders involving the immune mechanism: Secondary | ICD-10-CM | POA: Insufficient documentation

## 2012-08-03 DIAGNOSIS — K5900 Constipation, unspecified: Secondary | ICD-10-CM | POA: Insufficient documentation

## 2012-08-03 MED ORDER — POLYETHYLENE GLYCOL 3350 17 GM/SCOOP PO POWD: 17.0000 g | Freq: Two times a day (BID) | ORAL | Status: DC

## 2012-08-03 MED ORDER — FLEET ENEMA 7-19 GM/118ML RE ENEM
1.0000 | ENEMA | Freq: Once | RECTAL | Status: AC
  Administered 2012-08-03: 1 via RECTAL
  Filled 2012-08-03: qty 1

## 2012-08-03 NOTE — ED Notes (Signed)
Dr Ignacia Palma at bedside for disimpaction

## 2012-08-03 NOTE — ED Notes (Signed)
Pt reports having constipation. everytime she stands up, she has urge to have bowel movement but only able to pass small amounts each time.

## 2012-08-03 NOTE — ED Provider Notes (Signed)
History     CSN: [id number]  Arrival date & time 08/03/12  1710   First MD Initiated Contact with Patient 08/03/12 1733      Chief Complaint  Patient presents with  . Constipation    (Consider location/radiation/quality/duration/timing/severity/associated sxs/prior treatment) Patient is a 79 y.o. female presenting with constipation. The history is provided by the patient. No language interpreter was used.  Constipation  The current episode started yesterday (Gives a 2 day history of inability to have a bowel movement. She feels urgency to go when she stands but can't produce any stool. Her family says that once in the past she had to have disimpaction in the doctor's office.). The onset was gradual. The problem occurs rarely. The problem has been unchanged. The patient is experiencing no pain. Treatments tried: She takes over-the-counter medicines such as Senokot, milk of magnesia, but has had no relief with those remedies in the past couple of days. Pertinent negatives include no fever. She has received no recent medical care.    Past Medical History  Diagnosis Date  . Diabetes mellitus   . Hypertension   . Second degree Mobitz II AV block     s/p PPM  . Arthritis   . Gout attack 03/2011    "for the first time; right wrist"    Past Surgical History  Procedure Laterality Date  . Cholecystectomy    . Pacemaker insertion  04/19/11    SJM Zephyr XL implanted by Dr Johney Frame for mobitz II second degree AV block  . Back surgery      "have had 3 diskectomies"  . Cataract extraction, bilateral      History reviewed. No pertinent family history.  History  Substance Use Topics  . Smoking status: Never Smoker   . Smokeless tobacco: Never Used  . Alcohol Use: No    OB History   Grav Para Term Preterm Abortions TAB SAB Ect Mult Living                  Review of Systems  Constitutional: Negative for fever and chills.  HENT: Negative.   Eyes: Negative.   Respiratory:  Negative.   Cardiovascular: Negative.   Gastrointestinal: Positive for constipation.  Genitourinary: Negative.   Skin: Negative.   Psychiatric/Behavioral: Negative.     Allergies  Avelox; Micardis; Neurontin; Oxycodone hcl er; and Pregabalin  Home Medications   Current Outpatient Rx  Name  Route  Sig  Dispense  Refill  . acetaminophen (TYLENOL) 500 MG tablet   Oral   Take 500 mg by mouth once as needed. For pain.          . bisoprolol-hydrochlorothiazide (ZIAC) 2.5-6.25 MG per tablet   Oral   Take 1 tablet by mouth daily.           Marland Kitchen glipiZIDE (GLUCOTROL XL) 5 MG 24 hr tablet   Oral   Take 5 mg by mouth daily.           . magnesium hydroxide (MILK OF MAGNESIA) 400 MG/5ML suspension   Oral   Take 30 mLs by mouth daily.           . sennosides-docusate sodium (SENOKOT-S) 8.6-50 MG tablet   Oral   Take 1 tablet by mouth daily as needed. For constipation          . traMADol (ULTRAM) 50 MG tablet   Oral   Take 50 mg by mouth every 8 (eight) hours as needed. For pain. Maximum dose=  8 tablets per day          . TRAVATAN Z 0.004 % SOLN ophthalmic solution                 BP 162/53  Pulse 60  Temp(Src) 98.7 F (37.1 C) (Oral)  Resp 18  SpO2 100%  Physical Exam  Nursing note and vitals reviewed. Constitutional: She is oriented to person, place, and time.  Slender elderly woman, awake and alert, able to give her own history.  HENT:  Head: Normocephalic and atraumatic.  Neck: Normal range of motion. Neck supple.  Abdominal: Soft. Bowel sounds are normal.  Genitourinary:  Rectal exam shows rectal ampulla filled with soft stool.  Some of this was removed by digital disimpation, then a Fleets enema was ordered.  Musculoskeletal: Normal range of motion. She exhibits no edema and no tenderness.  Neurological: She is alert and oriented to person, place, and time.  No sensory or motor deficit.  Skin: Skin is warm and dry.  Psychiatric: She has a normal mood  and affect. Her behavior is normal.    ED Course  Fecal disimpaction Performed by: Osvaldo Human Authorized by: Osvaldo Human Consent: Verbal consent obtained. Risks and benefits: risks, benefits and alternatives were discussed Consent given by: patient Patient understanding: patient states understanding of the procedure being performed Patient consent: the patient's understanding of the procedure matches consent given Site marked: the operative site was not marked Patient identity confirmed: verbally with patient Time out: Immediately prior to procedure a "time out" was called to verify the correct patient, procedure, equipment, support staff and site/side marked as required. Local anesthesia used: no Patient sedated: no Patient tolerance: Patient tolerated the procedure well with no immediate complications. Comments: Soft stool found in rectal ampulla, removed digitally.   (including critical care time)  Rx for Miralax.  F/U with her geriatrician, Merlene Laughter, M.D.    1. Constipation        Carleene Cooper III, MD 08/04/12 1355

## 2012-08-04 NOTE — ED Provider Notes (Signed)
History     CSN: [id number]  Arrival date & time 08/03/12  1710   First MD Initiated Contact with Patient 08/03/12 1733      Chief Complaint  Patient presents with  . Constipation    (Consider location/radiation/quality/duration/timing/severity/associated sxs/prior treatment) HPI  Past Medical History  Diagnosis Date  . Diabetes mellitus   . Hypertension   . Second degree Mobitz II AV block     s/p PPM  . Arthritis   . Gout attack 03/2011    "for the first time; right wrist"    Past Surgical History  Procedure Laterality Date  . Cholecystectomy    . Pacemaker insertion  04/19/11    SJM Zephyr XL implanted by Dr Johney Frame for mobitz II second degree AV block  . Back surgery      "have had 3 diskectomies"  . Cataract extraction, bilateral      History reviewed. No pertinent family history.  History  Substance Use Topics  . Smoking status: Never Smoker   . Smokeless tobacco: Never Used  . Alcohol Use: No    OB History   Grav Para Term Preterm Abortions TAB SAB Ect Mult Living                  Review of Systems  Allergies  Avelox; Micardis; Neurontin; Oxycodone hcl er; and Pregabalin  Home Medications   Current Outpatient Rx  Name  Route  Sig  Dispense  Refill  . acetaminophen (TYLENOL) 500 MG tablet   Oral   Take 500 mg by mouth once as needed. For pain.          . bisoprolol-hydrochlorothiazide (ZIAC) 2.5-6.25 MG per tablet   Oral   Take 1 tablet by mouth daily.           Marland Kitchen glipiZIDE (GLUCOTROL XL) 5 MG 24 hr tablet   Oral   Take 5 mg by mouth daily.           . magnesium hydroxide (MILK OF MAGNESIA) 400 MG/5ML suspension   Oral   Take 30 mLs by mouth daily.           . polyethylene glycol powder (GLYCOLAX/MIRALAX) powder   Oral   Take 17 g by mouth 2 (two) times daily.   255 g   0   . sennosides-docusate sodium (SENOKOT-S) 8.6-50 MG tablet   Oral   Take 1 tablet by mouth daily as needed. For constipation          . traMADol  (ULTRAM) 50 MG tablet   Oral   Take 50 mg by mouth every 8 (eight) hours as needed. For pain. Maximum dose= 8 tablets per day          . TRAVATAN Z 0.004 % SOLN ophthalmic solution                 BP 160/61  Pulse 60  Temp(Src) 98.7 F (37.1 C) (Oral)  Resp 18  SpO2 99%  Physical Exam  ED Course  Procedures (including critical care time)  Labs Reviewed  OCCULT BLOOD, POC DEVICE - Abnormal; Notable for the following:    Fecal Occult Bld POSITIVE (*)    All other components within normal limits  OCCULT BLOOD, POC DEVICE   No results found.   1. Constipation          Carleene Cooper III, MD 08/04/12 1355

## 2012-08-06 LAB — OCCULT BLOOD, POC DEVICE

## 2012-11-22 ENCOUNTER — Encounter (HOSPITAL_COMMUNITY): Payer: Self-pay | Admitting: Emergency Medicine

## 2012-11-22 ENCOUNTER — Emergency Department (HOSPITAL_COMMUNITY)
Admission: EM | Admit: 2012-11-22 | Discharge: 2012-11-22 | Disposition: A | Discharge status: Home / Self Care | Payer: Medicare Other | Attending: Emergency Medicine | Admitting: Emergency Medicine

## 2012-11-22 DIAGNOSIS — Z792 Long term (current) use of antibiotics: Secondary | ICD-10-CM | POA: Insufficient documentation

## 2012-11-22 DIAGNOSIS — Z862 Personal history of diseases of the blood and blood-forming organs and certain disorders involving the immune mechanism: Secondary | ICD-10-CM | POA: Insufficient documentation

## 2012-11-22 DIAGNOSIS — I10 Essential (primary) hypertension: Secondary | ICD-10-CM | POA: Insufficient documentation

## 2012-11-22 DIAGNOSIS — N390 Urinary tract infection, site not specified: Secondary | ICD-10-CM | POA: Insufficient documentation

## 2012-11-22 DIAGNOSIS — Z8739 Personal history of other diseases of the musculoskeletal system and connective tissue: Secondary | ICD-10-CM | POA: Insufficient documentation

## 2012-11-22 DIAGNOSIS — Z8639 Personal history of other endocrine, nutritional and metabolic disease: Secondary | ICD-10-CM | POA: Insufficient documentation

## 2012-11-22 DIAGNOSIS — Z79899 Other long term (current) drug therapy: Secondary | ICD-10-CM | POA: Insufficient documentation

## 2012-11-22 DIAGNOSIS — R42 Dizziness and giddiness: Secondary | ICD-10-CM | POA: Insufficient documentation

## 2012-11-22 DIAGNOSIS — Z8679 Personal history of other diseases of the circulatory system: Secondary | ICD-10-CM | POA: Insufficient documentation

## 2012-11-22 DIAGNOSIS — Z950 Presence of cardiac pacemaker: Secondary | ICD-10-CM | POA: Insufficient documentation

## 2012-11-22 DIAGNOSIS — E119 Type 2 diabetes mellitus without complications: Secondary | ICD-10-CM | POA: Insufficient documentation

## 2012-11-22 LAB — BASIC METABOLIC PANEL
CO2: 25 mEq/L (ref 19–32)
Glucose, Bld: 98 mg/dL (ref 70–99)
Potassium: 4.7 mEq/L (ref 3.5–5.1)
Sodium: 135 mEq/L (ref 135–145)

## 2012-11-22 LAB — URINALYSIS, ROUTINE W REFLEX MICROSCOPIC
Hgb urine dipstick: NEGATIVE
Nitrite: NEGATIVE
Specific Gravity, Urine: 1.009 (ref 1.005–1.030)
Urobilinogen, UA: 0.2 mg/dL (ref 0.0–1.0)

## 2012-11-22 LAB — CBC WITH DIFFERENTIAL/PLATELET
Lymphocytes Relative: 23 % (ref 12–46)
Lymphs Abs: 2 10*3/uL (ref 0.7–4.0)
MCV: 90.5 fL (ref 78.0–100.0)
Neutrophils Relative %: 61 % (ref 43–77)
Platelets: 223 10*3/uL (ref 150–400)
RBC: 3.8 MIL/uL — ABNORMAL LOW (ref 3.87–5.11)
WBC: 8.5 10*3/uL (ref 4.0–10.5)

## 2012-11-22 LAB — URINE MICROSCOPIC-ADD ON

## 2012-11-22 MED ORDER — CEFIXIME 400 MG PO TABS
400.0000 mg | ORAL_TABLET | Freq: Once | ORAL | Status: AC
  Administered 2012-11-22: 400 mg via ORAL
  Filled 2012-11-22: qty 1

## 2012-11-22 MED ORDER — CEFIXIME 400 MG PO TABS: 400.0000 mg | ORAL_TABLET | Freq: Every day | ORAL | Status: DC

## 2012-11-22 MED ORDER — MECLIZINE HCL 12.5 MG PO TABS: 12.5000 mg | ORAL_TABLET | Freq: Three times a day (TID) | ORAL | Status: DC | PRN

## 2012-11-22 MED ORDER — MECLIZINE HCL 25 MG PO TABS
12.5000 mg | ORAL_TABLET | Freq: Once | ORAL | Status: AC
  Administered 2012-11-22: 12.5 mg via ORAL
  Filled 2012-11-22: qty 1

## 2012-11-22 NOTE — ED Notes (Signed)
Pt comes from home c/o dizziness. Pt is on antibiotics for UTI. Pt taken of b/p a week ago.

## 2012-11-22 NOTE — ED Provider Notes (Signed)
CSN: [id number]     Arrival date & time 11/22/12  0548 History     First MD Initiated Contact with Patient 11/22/12 [phone]     Chief Complaint  Patient presents with  . Dizziness   (Consider location/radiation/quality/duration/timing/severity/associated sxs/prior Treatment) HPI 79 yo female presents to the ER from home via EMS with complaint of dizziness, described as the room moving around her.  sxs started this morning around 430 am when she sat up to go to the bathroom.  Once she laid back down, the spinning sensation slowly resolved.  Pt recently started on cipro 3 days ago for UTI.  No fever, chills.  No n/v/d.  No weakness, numbness.  No prior h/o same.  No syncope or near syncope sxs.   No chest pain, sob, headache.  Pt was taken off her bp meds about a week ago.    Past Medical History  Diagnosis Date  . Diabetes mellitus   . Hypertension   . Second degree Mobitz II AV block     s/p PPM  . Arthritis   . Gout attack 03/2011    "for the first time; right wrist"   Past Surgical History  Procedure Laterality Date  . Cholecystectomy    . Pacemaker insertion  04/19/11    SJM Zephyr XL implanted by Dr Johney Frame for mobitz II second degree AV block  . Back surgery      "have had 3 diskectomies"  . Cataract extraction, bilateral     History reviewed. No pertinent family history. History  Substance Use Topics  . Smoking status: Never Smoker   . Smokeless tobacco: Never Used  . Alcohol Use: No   OB History   Grav Para Term Preterm Abortions TAB SAB Ect Mult Living                 Review of Systems  All other systems reviewed and are negative.    Allergies  Avelox; Micardis; Neurontin; Oxycodone hcl er; and Pregabalin  Home Medications   Current Outpatient Rx  Name  Route  Sig  Dispense  Refill  . ciprofloxacin (CIPRO) 500 MG tablet   Oral   Take 500 mg by mouth 2 (two) times daily. For 5 days only         . glipiZIDE (GLUCOTROL XL) 2.5 MG 24 hr tablet   Oral    Take 2.5 mg by mouth daily.         Marland Kitchen lubiprostone (AMITIZA) 24 MCG capsule   Oral   Take 24 mcg by mouth daily with breakfast.         . traMADol (ULTRAM) 50 MG tablet   Oral   Take 50 mg by mouth every 8 (eight) hours as needed. For pain. Maximum dose= 8 tablets per day          . TRAVATAN Z 0.004 % SOLN ophthalmic solution   Both Eyes   Place 1 drop into both eyes.          . polyethylene glycol powder (GLYCOLAX/MIRALAX) powder   Oral   Take 17 g by mouth 2 (two) times daily.   255 g   0   . sennosides-docusate sodium (SENOKOT-S) 8.6-50 MG tablet   Oral   Take 1 tablet by mouth daily as needed. For constipation           BP 155/74  Pulse 80  Temp(Src) 98.4 F (36.9 C) (Oral)  Resp 16  SpO2 99% Physical Exam  Nursing note and vitals reviewed. Constitutional: She is oriented to person, place, and time. She appears well-developed and well-nourished.  Frail elderly female NAD  HENT:  Head: Normocephalic and atraumatic.  Right Ear: External ear normal.  Left Ear: External ear normal.  Nose: Nose normal.  Mouth/Throat: Oropharynx is clear and moist.  Eyes: Conjunctivae and EOM are normal. Pupils are equal, round, and reactive to light.  Neck: Normal range of motion. Neck supple. No JVD present. No tracheal deviation present. No thyromegaly present.  Cardiovascular: Normal rate, regular rhythm, normal heart sounds and intact distal pulses.  Exam reveals no gallop and no friction rub.   No murmur heard. Pulmonary/Chest: Effort normal and breath sounds normal. No stridor. No respiratory distress. She has no wheezes. She has no rales. She exhibits no tenderness.  Abdominal: Soft. Bowel sounds are normal. She exhibits no distension and no mass. There is no tenderness. There is no rebound and no guarding.  Musculoskeletal: Normal range of motion. She exhibits edema (trace bilaterally). She exhibits no tenderness.  Lymphadenopathy:    She has no cervical adenopathy.   Neurological: She is alert and oriented to person, place, and time. She has normal reflexes. No cranial nerve deficit. She exhibits normal muscle tone. Coordination normal.  Pt reports spinning sensation when sitting upright, no specific directionality reported by patient.  Mild nystagmus to the left when pt reports spinning sensation.  Sxs resolved after about 5 min of laying flat  Skin: Skin is warm and dry. No rash noted. No erythema. No pallor.  Psychiatric: She has a normal mood and affect. Her behavior is normal. Judgment and thought content normal.    ED Course   Procedures (including critical care time)  Labs Reviewed  URINALYSIS, ROUTINE W REFLEX MICROSCOPIC - Abnormal; Notable for the following:    Leukocytes, UA LARGE (*)    All other components within normal limits  CBC WITH DIFFERENTIAL - Abnormal; Notable for the following:    RBC 3.80 (*)    Hemoglobin 11.3 (*)    HCT 34.4 (*)    Monocytes Relative 14 (*)    Monocytes Absolute 1.2 (*)    All other components within normal limits  BASIC METABOLIC PANEL - Abnormal; Notable for the following:    GFR calc non Af Amer 53 (*)    GFR calc Af Amer 62 (*)    All other components within normal limits  URINE MICROSCOPIC-ADD ON    Date: 11/22/2012  Rate: 78  Rhythm: paced  QRS Axis: left  Intervals: normal  ST/T Wave abnormalities: normal  Conduction Disutrbances:nonspecific intraventricular conduction delay  Narrative Interpretation:   Old EKG Reviewed: unchanged   No results found. 1. Vertigo     MDM  79 yo female with vertigo this morning when sitting up.  Neuro exam otherwise unremarkable, vertigo is able to be provoked with sitting up with slight nystagmus.  Suspect BPPV.  Pt recently started on cipro, which can cause some dizziness.  Will check labs, ua.  May be able to change cipro to suprax or macrobid to eliminate cipro as cause.  Will give 1/2 dose of antivert and reassess.  7:31 AM  Pt is feeling some  better, when sitting up only slight vertigo.  No vertigo when supine.  Do not suspect posterior stroke.  Given age and unknown time of onset, not TPA candidate.  No ams or headache to suggest serious etiology.  Will change abx to suprax, continue meclizine.  Olivia Mackie, MD 11/22/12 [phone]

## 2013-05-07 ENCOUNTER — Encounter: Payer: Self-pay | Admitting: Internal Medicine

## 2013-05-07 ENCOUNTER — Ambulatory Visit (INDEPENDENT_AMBULATORY_CARE_PROVIDER_SITE_OTHER): Payer: Medicare Other | Admitting: Internal Medicine

## 2013-05-07 VITALS — BP 140/82 | HR 83 | Ht 59.0 in | Wt 98.2 lb

## 2013-05-07 DIAGNOSIS — I10 Essential (primary) hypertension: Secondary | ICD-10-CM | POA: Insufficient documentation

## 2013-05-07 DIAGNOSIS — I441 Atrioventricular block, second degree: Secondary | ICD-10-CM

## 2013-05-07 LAB — MDC_IDC_ENUM_SESS_TYPE_INCLINIC
Battery Impedance: 1000 Ohm — CL
Battery Remaining Longevity: 120 mo
Battery Voltage: 2.78 V
Brady Statistic AP VP Percent: 56 %
Brady Statistic AS VP Percent: 43 %
Brady Statistic AS VS Percent: 1 % — CL
Date Time Interrogation Session: 20150121121017
Implantable Pulse Generator Model: 5826
Implantable Pulse Generator Serial Number: 7278688
Lead Channel Impedance Value: 382 Ohm
Lead Channel Impedance Value: 645 Ohm
Lead Channel Pacing Threshold Pulse Width: 0.4 ms
Lead Channel Setting Pacing Amplitude: 2 V
Lead Channel Setting Pacing Pulse Width: 0.4 ms
Lead Channel Setting Sensing Sensitivity: 2 mV
MDC IDC MSMT LEADCHNL RA PACING THRESHOLD AMPLITUDE: 0.5 V
MDC IDC MSMT LEADCHNL RA PACING THRESHOLD PULSEWIDTH: 0.4 ms
MDC IDC MSMT LEADCHNL RA SENSING INTR AMPL: 1.2 mV
MDC IDC MSMT LEADCHNL RV PACING THRESHOLD AMPLITUDE: 0.5 V
MDC IDC STAT BRADY AP VS PERCENT: 1 % — AB

## 2013-05-07 NOTE — Patient Instructions (Signed)
Your physician wants you to follow-up in: 6 months in the device clinic and 12 months with Dr Allred You will receive a reminder letter in the mail two months in advance. If you don't receive a letter, please call our office to schedule the follow-up appointment.  

## 2013-05-07 NOTE — Progress Notes (Signed)
PCP:  Ginette OttoSTONEKING,HAL THOMAS, MD  The patient presents today for routine electrophysiology followup.  Since her last visit, the patient reports doing very well.  She remains active for her age. Today, she denies symptoms of palpitations, chest pain, shortness of breath,  lower extremity edema, dizziness, presyncope, or syncope.  The patient feels that she is tolerating medications without difficulties and is otherwise without complaint today.   Past Medical History  Diagnosis Date  . Diabetes mellitus   . Hypertension   . Second degree Mobitz II AV block     s/p PPM  . Arthritis   . Gout attack 03/2011    "for the first time; right wrist"   Past Surgical History  Procedure Laterality Date  . Cholecystectomy    . Pacemaker insertion  04/19/11    SJM Zephyr XL implanted by Dr Johney FrameAllred for mobitz II second degree AV block  . Back surgery      "have had 3 diskectomies"  . Cataract extraction, bilateral      Current Outpatient Prescriptions  Medication Sig Dispense Refill  . furosemide (LASIX) 20 MG tablet Take 1 tablet by mouth daily.      Marland Kitchen. lubiprostone (AMITIZA) 24 MCG capsule Take 24 mcg by mouth daily with breakfast.      . meclizine (ANTIVERT) 12.5 MG tablet Take 1 tablet (12.5 mg total) by mouth 3 (three) times daily as needed for dizziness.  30 tablet  0  . polyethylene glycol powder (GLYCOLAX/MIRALAX) powder Take 17 g by mouth 2 (two) times daily.  255 g  0  . sennosides-docusate sodium (SENOKOT-S) 8.6-50 MG tablet Take 1 tablet by mouth daily as needed. For constipation       . traMADol (ULTRAM) 50 MG tablet Take 50 mg by mouth every 8 (eight) hours as needed. For pain. Maximum dose= 8 tablets per day       . TRAVATAN Z 0.004 % SOLN ophthalmic solution Place 1 drop into both eyes.        No current facility-administered medications for this visit.    Allergies  Allergen Reactions  . Avelox [Moxifloxacin Hydrochloride]     Causes weakness  . Micardis [Telmisartan]     Causes  hyperkalemia  . Neurontin [Gabapentin]     Causes dizziness  . Oxycodone Hcl     Causes dizziness  . Pregabalin     Causes dizziness    History   Social History  . Marital Status: Widowed    Spouse Name: N/A    Number of Children: N/A  . Years of Education: N/A   Occupational History  . Not on file.   Social History Main Topics  . Smoking status: Never Smoker   . Smokeless tobacco: Never Used  . Alcohol Use: No  . Drug Use: No  . Sexual Activity: No   Other Topics Concern  . Not on file   Social History Narrative  . No narrative on file    No family history on file.  ROS-  All systems are reviewed and are negative except as outlined in the HPI above   Physical Exam: Filed Vitals:   05/07/13 1114  BP: 140/82  Pulse: 83  Height: 4\' 11"  (1.499 m)  Weight: 98 lb 4 oz (44.566 kg)    GEN- The patient is elderly but well appearing, alert and oriented x 3 today.   Head- normocephalic, atraumatic Eyes-  Sclera clear, conjunctiva pink Ears- hearing intact Oropharynx- clear Lungs- Clear to ausculation bilaterally,  normal work of breathing Chest- pacemaker pocket is well healed Heart- Regular rate and rhythm, no murmurs, rubs or gallops, PMI not laterally displaced GI- soft, NT, ND, + BS Extremities- no clubbing, cyanosis, or edema  Pacemaker interrogation- reviewed in detail today,  See PACEART report  Assessment and Plan:   Second degree Mobitz II AV block   Normal pacemaker function  See Pace Art report  No changes today  HTN Stable No change required today   Return to the device clinic in 6months I will see in a year

## 2013-11-26 ENCOUNTER — Encounter (HOSPITAL_COMMUNITY): Payer: Self-pay | Admitting: Emergency Medicine

## 2013-11-26 ENCOUNTER — Emergency Department (HOSPITAL_COMMUNITY): Payer: Medicare Other

## 2013-11-26 ENCOUNTER — Emergency Department (HOSPITAL_COMMUNITY)
Admission: EM | Admit: 2013-11-26 | Discharge: 2013-11-26 | Disposition: A | Discharge status: Home / Self Care | Payer: Medicare Other | Attending: Emergency Medicine | Admitting: Emergency Medicine

## 2013-11-26 DIAGNOSIS — I498 Other specified cardiac arrhythmias: Secondary | ICD-10-CM | POA: Diagnosis not present

## 2013-11-26 DIAGNOSIS — R079 Chest pain, unspecified: Secondary | ICD-10-CM | POA: Insufficient documentation

## 2013-11-26 DIAGNOSIS — Z79899 Other long term (current) drug therapy: Secondary | ICD-10-CM | POA: Diagnosis not present

## 2013-11-26 DIAGNOSIS — Z8739 Personal history of other diseases of the musculoskeletal system and connective tissue: Secondary | ICD-10-CM | POA: Diagnosis not present

## 2013-11-26 DIAGNOSIS — I10 Essential (primary) hypertension: Secondary | ICD-10-CM | POA: Insufficient documentation

## 2013-11-26 DIAGNOSIS — Z9089 Acquired absence of other organs: Secondary | ICD-10-CM | POA: Insufficient documentation

## 2013-11-26 DIAGNOSIS — R002 Palpitations: Secondary | ICD-10-CM

## 2013-11-26 DIAGNOSIS — E119 Type 2 diabetes mellitus without complications: Secondary | ICD-10-CM | POA: Diagnosis not present

## 2013-11-26 LAB — COMPREHENSIVE METABOLIC PANEL
ALBUMIN: 3.7 g/dL (ref 3.5–5.2)
ALT: 16 U/L (ref 0–35)
AST: 25 U/L (ref 0–37)
Alkaline Phosphatase: 41 U/L (ref 39–117)
Anion gap: 11 (ref 5–15)
BILIRUBIN TOTAL: 0.2 mg/dL — AB (ref 0.3–1.2)
BUN: 23 mg/dL (ref 6–23)
CHLORIDE: 107 meq/L (ref 96–112)
CO2: 24 mEq/L (ref 19–32)
CREATININE: 0.77 mg/dL (ref 0.50–1.10)
Calcium: 9.7 mg/dL (ref 8.4–10.5)
GFR calc Af Amer: 77 mL/min — ABNORMAL LOW (ref >90)
GFR calc non Af Amer: 66 mL/min — ABNORMAL LOW (ref >90)
Glucose, Bld: 120 mg/dL — ABNORMAL HIGH (ref 70–99)
POTASSIUM: 4.7 meq/L (ref 3.7–5.3)
Sodium: 142 mEq/L (ref 137–147)
TOTAL PROTEIN: 7.4 g/dL (ref 6.0–8.3)

## 2013-11-26 LAB — CBC WITH DIFFERENTIAL/PLATELET
BASOS ABS: 0 10*3/uL (ref 0.0–0.1)
BASOS PCT: 0 % (ref 0–1)
Eosinophils Absolute: 0.1 10*3/uL (ref 0.0–0.7)
Eosinophils Relative: 2 % (ref 0–5)
HCT: 36 % (ref 36.0–46.0)
Hemoglobin: 11.7 g/dL — ABNORMAL LOW (ref 12.0–15.0)
Lymphocytes Relative: 24 % (ref 12–46)
Lymphs Abs: 1.3 10*3/uL (ref 0.7–4.0)
MCH: 31.2 pg (ref 26.0–34.0)
MCHC: 32.5 g/dL (ref 30.0–36.0)
MCV: 96 fL (ref 78.0–100.0)
MONO ABS: 0.4 10*3/uL (ref 0.1–1.0)
Monocytes Relative: 7 % (ref 3–12)
NEUTROS PCT: 67 % (ref 43–77)
Neutro Abs: 3.7 10*3/uL (ref 1.7–7.7)
Platelets: 199 10*3/uL (ref 150–400)
RBC: 3.75 MIL/uL — ABNORMAL LOW (ref 3.87–5.11)
RDW: 12.4 % (ref 11.5–15.5)
WBC: 5.6 10*3/uL (ref 4.0–10.5)

## 2013-11-26 LAB — I-STAT TROPONIN, ED: TROPONIN I, POC: 0.01 ng/mL (ref 0.00–0.08)

## 2013-11-26 NOTE — ED Notes (Signed)
Pt refuses to get into pt gown pt insisted to remain dressed in her clothes

## 2013-11-26 NOTE — ED Provider Notes (Signed)
CSN: [id number]     Arrival date & time 11/26/13  1311 History   First MD Initiated Contact with Patient 11/26/13 1339     Chief Complaint  Patient presents with  . Chest Pain     (Consider location/radiation/quality/duration/timing/severity/associated sxs/prior Treatment) HPI Comments: 80 y.o. female w hx of DM, HTN, 2nd degree mobitz II AV block s/p pacemaker placement who presents with the complaint of a rattling in her chest. She is not sure how long her symptoms lasted but states that they improved when she sat up in bed. She is currently asymptomatic. Her symptoms appear to be brief. The severity is mild. She has not had similar symptoms in the past. Her symptoms were relieved with sitting up in bed.  Patient is a 80 y.o. female presenting with chest pain.  Chest Pain Associated symptoms: no abdominal pain, no back pain, no cough, no dizziness, no fatigue, no fever, no headache, no nausea, no shortness of breath and not vomiting     Past Medical History  Diagnosis Date  . Diabetes mellitus   . Hypertension   . Second degree Mobitz II AV block     s/p PPM  . Arthritis   . Gout attack 03/2011    "for the first time; right wrist"   Past Surgical History  Procedure Laterality Date  . Cholecystectomy    . Pacemaker insertion  04/19/11    SJM Zephyr XL implanted by Dr Johney Frame for mobitz II second degree AV block  . Back surgery      "have had 3 diskectomies"  . Cataract extraction, bilateral     No family history on file. History  Substance Use Topics  . Smoking status: Never Smoker   . Smokeless tobacco: Never Used  . Alcohol Use: No   OB History   Grav Para Term Preterm Abortions TAB SAB Ect Mult Living                 Review of Systems  Constitutional: Negative for fever and fatigue.  HENT: Negative for congestion and drooling.   Eyes: Negative for pain.  Respiratory: Negative for cough and shortness of breath.   Cardiovascular: Negative for chest pain.   Gastrointestinal: Negative for nausea, vomiting, abdominal pain and diarrhea.  Genitourinary: Negative for dysuria and hematuria.  Musculoskeletal: Negative for back pain, gait problem and neck pain.  Skin: Negative for color change.  Neurological: Negative for dizziness and headaches.  Hematological: Negative for adenopathy.  Psychiatric/Behavioral: Negative for behavioral problems.  All other systems reviewed and are negative.     Allergies  Avelox; Micardis; Neurontin; Oxycodone hcl; and Pregabalin  Home Medications   Prior to Admission medications   Medication Sig Start Date End Date Taking? Authorizing Provider  lubiprostone (AMITIZA) 24 MCG capsule Take 24 mcg by mouth daily with breakfast.   Yes Historical Provider, MD   BP 161/65  Pulse 74  Temp(Src) 98.2 F (36.8 C) (Oral)  Resp 20  SpO2 100% Physical Exam  Nursing note and vitals reviewed. Constitutional: She appears well-developed and well-nourished.  HENT:  Head: Normocephalic and atraumatic.  Mouth/Throat: Oropharynx is clear and moist. No oropharyngeal exudate.  Eyes: Conjunctivae and EOM are normal. Pupils are equal, round, and reactive to light.  Neck: Normal range of motion. Neck supple.  Cardiovascular: Normal rate, regular rhythm, normal heart sounds and intact distal pulses.  Exam reveals no gallop and no friction rub.   No murmur heard. Pulmonary/Chest: Effort normal and breath sounds normal.  No respiratory distress. She has no wheezes.  Abdominal: Soft. Bowel sounds are normal. There is no tenderness. There is no rebound and no guarding.  Musculoskeletal: Normal range of motion. She exhibits no edema and no tenderness.  Neurological: She is alert.  Skin: Skin is warm and dry.  Psychiatric: She has a normal mood and affect. Her behavior is normal.    ED Course  Procedures (including critical care time) Labs Review Labs Reviewed  CBC WITH DIFFERENTIAL - Abnormal; Notable for the following:     RBC 3.75 (*)    Hemoglobin 11.7 (*)    All other components within normal limits  COMPREHENSIVE METABOLIC PANEL - Abnormal; Notable for the following:    Glucose, Bld 120 (*)    Total Bilirubin 0.2 (*)    GFR calc non Af Amer 66 (*)    GFR calc Af Amer 77 (*)    All other components within normal limits  I-STAT TROPOININ, ED    Imaging Review Dg Chest 2 View  11/26/2013   CLINICAL DATA:  Left-sided chest pain  EXAM: CHEST  2 VIEW  COMPARISON:  04/20/2011  FINDINGS: Dual lead pacemaker is in place with leads in the region of the right atrium and right ventricle. The patient is rotated towards the right. Heart size is normal. There chronic lung markings but no evidence of active infiltrate, mass, effusion or collapse. Bony structures are unremarkable.  IMPRESSION: No active disease evident.   Electronically Signed   By: Paulina FusiMark  Shogry M.D.   On: 11/26/2013 14:32     EKG Interpretation   Date/Time:  Wednesday November 26 2013 13:15:13 EDT Ventricular Rate:  76 PR Interval:  222 QRS Duration: 178 QT Interval:  430 QTC Calculation: 483 R Axis:   -61 Text Interpretation:  Electronic ventricular pacemaker No significant  change since last tracing Confirmed by Khair Chasteen  MD, Willman Cuny ([id number]) on  11/26/2013 1:57:08 PM      MDM   Final diagnoses:  Fluttering sensation of heart    2:37 PM [age] y.o. female w hx of DM, HTN, 2nd degree mobitz II AV block s/p pacemaker placement who presents with the complaint of a rattling in her chest. She states that she awoke at 4 AM this morning with a rattling sensation in her chest. She denies any pain or shortness of breath. She's not sure how long her symptoms lasted but she felt better once she sat up in bed. She is currently asymptomatic and has been so for hours. She is afebrile and vital signs are unremarkable here. She has no other complaints and the family states that she is otherwise been well. Screening labwork and imaging sent prior to my  evaluation.  3:53 PM: I interpreted/reviewed the labs and/or imaging which were non-contributory.  Pt remains asx.  I have discussed the diagnosis/risks/treatment options with the patient and family and believe the pt to be eligible for discharge home to follow-up with pcp as needed. We also discussed returning to the ED immediately if new or worsening sx occur. We discussed the sx which are most concerning (e.g., recurrence of sx, cp, sob, fever) that necessitate immediate return. Medications administered to the patient during their visit and any new prescriptions provided to the patient are listed below.  Medications given during this visit Medications - No data to display  New Prescriptions   No medications on file     Purvis SheffieldForrest Misao Fackrell, MD 11/26/13 1554

## 2013-11-26 NOTE — ED Notes (Signed)
Pt presents to department for evaluation of L sided chest pain. Onset last night @ 04:00. Pt describes pain as "shaking" sensation. 5/10 pain at the time. Pt is alert and oriented x4. Respirations unlabored, skin warm and dry.

## 2013-11-26 NOTE — ED Notes (Signed)
Pt returned from Xray, Dr. Romeo AppleHarrison at bedside

## 2013-11-28 ENCOUNTER — Encounter: Payer: Self-pay | Admitting: *Deleted

## 2013-12-11 ENCOUNTER — Ambulatory Visit (INDEPENDENT_AMBULATORY_CARE_PROVIDER_SITE_OTHER): Payer: Medicare Other | Admitting: *Deleted

## 2013-12-11 DIAGNOSIS — I441 Atrioventricular block, second degree: Secondary | ICD-10-CM

## 2013-12-11 LAB — MDC_IDC_ENUM_SESS_TYPE_INCLINIC
Battery Impedance: 1000 Ohm — CL
Battery Voltage: 2.79 V
Date Time Interrogation Session: 20150827145426
Lead Channel Impedance Value: 350 Ohm
Lead Channel Pacing Threshold Amplitude: 0.5 V
Lead Channel Pacing Threshold Pulse Width: 0.4 ms
Lead Channel Setting Pacing Amplitude: 2 V
Lead Channel Setting Sensing Sensitivity: 2 mV
MDC IDC MSMT BATTERY REMAINING LONGEVITY: 120 mo
MDC IDC MSMT LEADCHNL RA PACING THRESHOLD AMPLITUDE: 0.5 V
MDC IDC MSMT LEADCHNL RA PACING THRESHOLD PULSEWIDTH: 0.4 ms
MDC IDC MSMT LEADCHNL RA SENSING INTR AMPL: 1.1 mV
MDC IDC MSMT LEADCHNL RV IMPEDANCE VALUE: 685 Ohm
MDC IDC PG SERIAL: 7278688
MDC IDC SET LEADCHNL RV PACING PULSEWIDTH: 0.4 ms
MDC IDC STAT BRADY RA PERCENT PACED: 29 %
MDC IDC STAT BRADY RV PERCENT PACED: 100 %

## 2013-12-11 NOTE — Progress Notes (Signed)
Pacemaker check in clinic. Normal device function. Thresholds, sensing, impedances consistent with previous measurements. Device programmed to maximize longevity. 20 mode switches the longest 1:22 minutes.  No high ventricular rates noted. Device programmed at appropriate safety margins. Histogram distribution appropriate for patient activity level. Device programmed to optimize intrinsic conduction. Estimated longevity 10 years.  Patient education completed.  ROV in February with Dr. Johney Frame.

## 2013-12-23 ENCOUNTER — Encounter: Payer: Self-pay | Admitting: Internal Medicine

## 2014-03-26 ENCOUNTER — Encounter (HOSPITAL_COMMUNITY): Payer: Self-pay | Admitting: Internal Medicine

## 2014-05-26 ENCOUNTER — Encounter: Payer: Self-pay | Admitting: *Deleted

## 2014-06-11 ENCOUNTER — Encounter: Payer: Self-pay | Admitting: *Deleted

## 2014-07-16 ENCOUNTER — Encounter: Payer: Self-pay | Admitting: *Deleted

## 2014-08-12 ENCOUNTER — Encounter: Payer: Self-pay | Admitting: *Deleted

## 2014-09-18 ENCOUNTER — Encounter: Payer: Self-pay | Admitting: *Deleted

## 2014-10-22 ENCOUNTER — Ambulatory Visit (INDEPENDENT_AMBULATORY_CARE_PROVIDER_SITE_OTHER): Payer: Medicare Other | Admitting: Internal Medicine

## 2014-10-22 ENCOUNTER — Encounter: Payer: Self-pay | Admitting: Internal Medicine

## 2014-10-22 VITALS — BP 136/54 | HR 63 | Ht 59.0 in

## 2014-10-22 DIAGNOSIS — I10 Essential (primary) hypertension: Secondary | ICD-10-CM

## 2014-10-22 DIAGNOSIS — I442 Atrioventricular block, complete: Secondary | ICD-10-CM | POA: Diagnosis not present

## 2014-10-22 LAB — CUP PACEART INCLINIC DEVICE CHECK
Battery Voltage: 2.8 V
Brady Statistic RA Percent Paced: 37 %
Brady Statistic RV Percent Paced: 99 %
Date Time Interrogation Session: 20160707153933
Lead Channel Impedance Value: 377 Ohm
Lead Channel Impedance Value: 617 Ohm
Lead Channel Pacing Threshold Amplitude: 0.5 V
Lead Channel Pacing Threshold Pulse Width: 0.4 ms
Lead Channel Sensing Intrinsic Amplitude: 0.9 mV
Lead Channel Setting Pacing Amplitude: 2 V
Lead Channel Setting Sensing Sensitivity: 2 mV
MDC IDC MSMT BATTERY IMPEDANCE: 1000 Ohm — AB
MDC IDC MSMT LEADCHNL RA PACING THRESHOLD PULSEWIDTH: 0.4 ms
MDC IDC MSMT LEADCHNL RV PACING THRESHOLD AMPLITUDE: 0.625 V
MDC IDC PG SERIAL: 7278688
MDC IDC SET LEADCHNL RV PACING PULSEWIDTH: 0.4 ms
Pulse Gen Model: 5826

## 2014-10-22 NOTE — Patient Instructions (Signed)
Medication Instructions: - no changes  Labwork: - none  Procedures/Testing: - none  Follow-Up: - Your physician wants you to follow-up in: 6 months (January 2017) with the device clinic & 1 year (July 2017) with Dr. Johney FrameAllred.You will receive a reminder letter in the mail two months in advance. If you don't receive a letter, please call our office to schedule the follow-up appointment.  Any Additional Special Instructions Will Be Listed Below (If Applicable).

## 2014-10-22 NOTE — Progress Notes (Signed)
PCP:  Ginette OttoSTONEKING,HAL THOMAS, MD  The patient presents today for routine electrophysiology followup.  Since her last visit, the patient reports doing very well.  She remains alert for her age but is not very active. Today, she denies symptoms of palpitations, chest pain, shortness of breath,  lower extremity edema, dizziness, presyncope, or syncope.  The patient feels that she is tolerating medications without difficulties and is otherwise without complaint today.   Past Medical History  Diagnosis Date  . Diabetes mellitus   . Hypertension   . Second degree Mobitz II AV block     s/p PPM  . Arthritis   . Gout attack 03/2011    "for the first time; right wrist"   Past Surgical History  Procedure Laterality Date  . Cholecystectomy    . Pacemaker insertion  04/19/11    SJM Zephyr XL implanted by Dr Johney FrameAllred for mobitz II second degree AV block  . Back surgery      "have had 3 diskectomies"  . Cataract extraction, bilateral    . Permanent pacemaker insertion Bilateral 04/19/2011    Procedure: PERMANENT PACEMAKER INSERTION;  Surgeon: Hillis RangeJames Chantavia Bazzle, MD;  [facility];  Service: Cardiovascular;  Laterality: Bilateral;    Current Outpatient Prescriptions  Medication Sig Dispense Refill  . acetaminophen (TYLENOL) 650 MG CR tablet Take 650 mg by mouth every 8 (eight) hours as needed for pain.     No current facility-administered medications for this visit.    Allergies  Allergen Reactions  . Avelox [Moxifloxacin Hydrochloride]     Causes weakness  . Micardis [Telmisartan]     Causes hyperkalemia  . Neurontin [Gabapentin]     Causes dizziness  . Oxycodone Hcl     Causes dizziness  . Pregabalin     Causes dizziness    History   Social History  . Marital Status: Widowed    Spouse Name: N/A  . Number of Children: N/A  . Years of Education: N/A   Occupational History  . Not on file.   Social History Main Topics  . Smoking status: Never Smoker   . Smokeless tobacco: Never  Used  . Alcohol Use: No  . Drug Use: No  . Sexual Activity: No   Other Topics Concern  . Not on file   Social History Narrative    No family history on file.  ROS-  All systems are reviewed and are negative except as outlined in the HPI above   Physical Exam: Filed Vitals:   10/22/14 1134  BP: 136/54  Pulse: 63  Height: 4\' 11"  (1.499 m)    GEN- The patient is elderly but well appearing, alert and oriented x 3 today.   Head- normocephalic, atraumatic Eyes-  Sclera clear, conjunctiva pink Ears- hearing intact Oropharynx- clear Lungs- Clear to ausculation bilaterally, normal work of breathing Chest- pacemaker pocket is well healed Heart- Regular rate and rhythm, no murmurs, rubs or gallops, PMI not laterally displaced GI- soft, NT, ND, + BS Extremities- no clubbing, cyanosis, or edema  Pacemaker interrogation- reviewed in detail today,  See PACEART report  Assessment and Plan:   Complete AV block   Normal pacemaker function  See Pace Art report  No changes today  HTN Stable No change required today   Return to the device clinic in 6months I will see in a year

## 2014-12-22 ENCOUNTER — Ambulatory Visit
Admission: RE | Admit: 2014-12-22 | Discharge: 2014-12-22 | Disposition: A | Discharge status: Home / Self Care | Payer: Medicare Other | Source: Ambulatory Visit | Attending: Geriatric Medicine | Admitting: Geriatric Medicine

## 2014-12-22 ENCOUNTER — Other Ambulatory Visit: Payer: Self-pay | Admitting: Geriatric Medicine

## 2014-12-22 DIAGNOSIS — M25552 Pain in left hip: Secondary | ICD-10-CM

## 2015-01-07 ENCOUNTER — Ambulatory Visit: Payer: Self-pay | Admitting: Podiatry

## 2015-01-20 ENCOUNTER — Ambulatory Visit: Payer: Self-pay | Admitting: Podiatry

## 2015-01-29 ENCOUNTER — Encounter: Payer: Self-pay | Admitting: Podiatry

## 2015-01-29 ENCOUNTER — Ambulatory Visit (INDEPENDENT_AMBULATORY_CARE_PROVIDER_SITE_OTHER): Payer: Medicare Other | Admitting: Podiatry

## 2015-01-29 DIAGNOSIS — M79605 Pain in left leg: Secondary | ICD-10-CM

## 2015-01-29 DIAGNOSIS — M79604 Pain in right leg: Secondary | ICD-10-CM

## 2015-01-29 DIAGNOSIS — B351 Tinea unguium: Secondary | ICD-10-CM | POA: Diagnosis not present

## 2015-01-29 NOTE — Progress Notes (Signed)
   Subjective:    Patient ID: [name] Fields, female    DOB: 1910/06/22, [age] y.o.   MRN: [id number]  HPI  PT REQUESTING FOR TOENAILS DEBRIDEMENT.  Review of Systems  Musculoskeletal: Positive for back pain.  Neurological: Positive for weakness.  All other systems reviewed and are negative.      Objective:   Physical Exam        Assessment & Plan:

## 2015-01-31 NOTE — Progress Notes (Signed)
Subjective:     Patient ID: Lelon FrohlichMargaret W Kats, female   DOB: [birth year], [age] y.o.   MRN: [id number]  HPI patient presents with significant nail disease 1-5 of both feet that are thick and painful and she cannot cut them and has caregiver with her today.   Review of Systems  All other systems reviewed and are negative.      Objective:   Physical Exam  Constitutional: She is oriented to person, place, and time.  Musculoskeletal: Normal range of motion.  Neurological: She is oriented to person, place, and time.  Skin: Skin is warm and dry.  Nursing note and vitals reviewed.  neurovascular status was found to be diminished bilateral with patient noted to have diminished range of motion muscle strength but normal for her age and is found to have severely thickened nailbeds 1-5 both feet with a condition where she is not been properly taken care of in her environment with pain upon palpation to the nailbeds     Assessment:     Patient who is not received adequate care with severe nail deformity 1-5 both feet    Plan:     Educated the family on proper care of nailbeds and debrided nailbeds 1-5 both feet and will be seen back as needed

## 2015-05-06 ENCOUNTER — Ambulatory Visit (INDEPENDENT_AMBULATORY_CARE_PROVIDER_SITE_OTHER): Payer: Medicare Other | Admitting: *Deleted

## 2015-05-06 ENCOUNTER — Encounter: Payer: Self-pay | Admitting: Internal Medicine

## 2015-05-06 DIAGNOSIS — Z950 Presence of cardiac pacemaker: Secondary | ICD-10-CM | POA: Diagnosis not present

## 2015-05-06 DIAGNOSIS — I442 Atrioventricular block, complete: Secondary | ICD-10-CM

## 2015-05-11 NOTE — Progress Notes (Signed)
Pacemaker check in clinic. Normal device function. Thresholds, sensing, impedances consistent with previous measurements. Device programmed to maximize longevity. 11 AMS episodes (<1%), no episode triggers enabled. Device programmed at appropriate safety margins. Histogram distribution appropriate for patient activity level. Device programmed to optimize intrinsic conduction. Estimated longevity 8.75-10 years. Patient education completed. ROV with JA in 10/2015.

## 2015-05-13 LAB — CUP PACEART INCLINIC DEVICE CHECK
Battery Impedance: 1000 Ohm — CL
Battery Voltage: 2.78 V
Brady Statistic RA Percent Paced: 32 %
Implantable Lead Implant Date: 20130102
Implantable Lead Implant Date: 20130102
Implantable Lead Location: 753859
Implantable Lead Location: 753860
Implantable Lead Model: 1948
Lead Channel Impedance Value: 384 Ohm
Lead Channel Impedance Value: 631 Ohm
Lead Channel Pacing Threshold Amplitude: 0.5 V
Lead Channel Pacing Threshold Amplitude: 0.5 V
Lead Channel Sensing Intrinsic Amplitude: 0.9 mV
Lead Channel Setting Pacing Amplitude: 2 V
MDC IDC MSMT LEADCHNL RA PACING THRESHOLD PULSEWIDTH: 0.4 ms
MDC IDC MSMT LEADCHNL RV PACING THRESHOLD PULSEWIDTH: 0.4 ms
MDC IDC SESS DTM: 20170119160848
MDC IDC SET LEADCHNL RV PACING PULSEWIDTH: 0.4 ms
MDC IDC SET LEADCHNL RV SENSING SENSITIVITY: 2 mV
MDC IDC STAT BRADY RV PERCENT PACED: 99 %
Pulse Gen Serial Number: 7278688

## 2015-07-08 ENCOUNTER — Other Ambulatory Visit: Payer: Self-pay | Admitting: Orthopedic Surgery

## 2015-07-08 ENCOUNTER — Encounter (HOSPITAL_COMMUNITY)
Admission: EM | Disposition: A | Discharge status: Skilled Nursing Facility | Payer: Self-pay | Source: Home / Self Care | Attending: Family Medicine

## 2015-07-08 ENCOUNTER — Inpatient Hospital Stay (HOSPITAL_COMMUNITY): Payer: Medicare Other

## 2015-07-08 ENCOUNTER — Inpatient Hospital Stay (HOSPITAL_COMMUNITY): Payer: Medicare Other | Admitting: Critical Care Medicine

## 2015-07-08 ENCOUNTER — Emergency Department (HOSPITAL_COMMUNITY): Payer: Medicare Other

## 2015-07-08 ENCOUNTER — Encounter (HOSPITAL_COMMUNITY): Payer: Self-pay | Admitting: *Deleted

## 2015-07-08 ENCOUNTER — Inpatient Hospital Stay (HOSPITAL_COMMUNITY)
Admission: EM | Admit: 2015-07-08 | Discharge: 2015-07-12 | DRG: 481 | Disposition: A | Discharge status: Skilled Nursing Facility | Payer: Medicare Other | Attending: Internal Medicine | Admitting: Internal Medicine

## 2015-07-08 DIAGNOSIS — I10 Essential (primary) hypertension: Secondary | ICD-10-CM | POA: Diagnosis present

## 2015-07-08 DIAGNOSIS — Z9841 Cataract extraction status, right eye: Secondary | ICD-10-CM | POA: Diagnosis not present

## 2015-07-08 DIAGNOSIS — D62 Acute posthemorrhagic anemia: Secondary | ICD-10-CM | POA: Diagnosis not present

## 2015-07-08 DIAGNOSIS — S7291XS Unspecified fracture of right femur, sequela: Secondary | ICD-10-CM | POA: Diagnosis not present

## 2015-07-08 DIAGNOSIS — Z66 Do not resuscitate: Secondary | ICD-10-CM | POA: Diagnosis present

## 2015-07-08 DIAGNOSIS — S7291XA Unspecified fracture of right femur, initial encounter for closed fracture: Secondary | ICD-10-CM | POA: Diagnosis present

## 2015-07-08 DIAGNOSIS — W1830XA Fall on same level, unspecified, initial encounter: Secondary | ICD-10-CM | POA: Diagnosis present

## 2015-07-08 DIAGNOSIS — E119 Type 2 diabetes mellitus without complications: Secondary | ICD-10-CM | POA: Diagnosis present

## 2015-07-08 DIAGNOSIS — I442 Atrioventricular block, complete: Secondary | ICD-10-CM | POA: Diagnosis not present

## 2015-07-08 DIAGNOSIS — S72301A Unspecified fracture of shaft of right femur, initial encounter for closed fracture: Secondary | ICD-10-CM

## 2015-07-08 DIAGNOSIS — R64 Cachexia: Secondary | ICD-10-CM | POA: Diagnosis present

## 2015-07-08 DIAGNOSIS — Z9842 Cataract extraction status, left eye: Secondary | ICD-10-CM | POA: Diagnosis not present

## 2015-07-08 DIAGNOSIS — F0390 Unspecified dementia without behavioral disturbance: Secondary | ICD-10-CM | POA: Diagnosis present

## 2015-07-08 DIAGNOSIS — D649 Anemia, unspecified: Secondary | ICD-10-CM | POA: Diagnosis not present

## 2015-07-08 DIAGNOSIS — Z681 Body mass index (BMI) 19 or less, adult: Secondary | ICD-10-CM

## 2015-07-08 DIAGNOSIS — S72341A Displaced spiral fracture of shaft of right femur, initial encounter for closed fracture: Principal | ICD-10-CM | POA: Diagnosis present

## 2015-07-08 DIAGNOSIS — W19XXXS Unspecified fall, sequela: Secondary | ICD-10-CM | POA: Diagnosis not present

## 2015-07-08 DIAGNOSIS — M1990 Unspecified osteoarthritis, unspecified site: Secondary | ICD-10-CM | POA: Diagnosis present

## 2015-07-08 DIAGNOSIS — S72309A Unspecified fracture of shaft of unspecified femur, initial encounter for closed fracture: Secondary | ICD-10-CM | POA: Diagnosis not present

## 2015-07-08 DIAGNOSIS — Z950 Presence of cardiac pacemaker: Secondary | ICD-10-CM | POA: Diagnosis not present

## 2015-07-08 DIAGNOSIS — S72009A Fracture of unspecified part of neck of unspecified femur, initial encounter for closed fracture: Secondary | ICD-10-CM | POA: Diagnosis not present

## 2015-07-08 DIAGNOSIS — M25551 Pain in right hip: Secondary | ICD-10-CM | POA: Diagnosis present

## 2015-07-08 HISTORY — PX: INTRAMEDULLARY (IM) NAIL INTERTROCHANTERIC: SHX5875

## 2015-07-08 HISTORY — DX: Presence of cardiac pacemaker: Z95.0

## 2015-07-08 LAB — CBC WITH DIFFERENTIAL/PLATELET
Basophils Absolute: 0 10*3/uL (ref 0.0–0.1)
Basophils Relative: 0 %
Eosinophils Absolute: 0 10*3/uL (ref 0.0–0.7)
Eosinophils Relative: 0 %
HEMATOCRIT: 31.6 % — AB (ref 36.0–46.0)
Hemoglobin: 10.1 g/dL — ABNORMAL LOW (ref 12.0–15.0)
LYMPHS PCT: 8 %
Lymphs Abs: 0.7 10*3/uL (ref 0.7–4.0)
MCH: 30.3 pg (ref 26.0–34.0)
MCHC: 32 g/dL (ref 30.0–36.0)
MCV: 94.9 fL (ref 78.0–100.0)
MONOS PCT: 6 %
Monocytes Absolute: 0.5 10*3/uL (ref 0.1–1.0)
NEUTROS ABS: 7.2 10*3/uL (ref 1.7–7.7)
NEUTROS PCT: 86 %
Platelets: 187 10*3/uL (ref 150–400)
RBC: 3.33 MIL/uL — AB (ref 3.87–5.11)
RDW: 13 % (ref 11.5–15.5)
WBC: 8.4 10*3/uL (ref 4.0–10.5)

## 2015-07-08 LAB — GLUCOSE, CAPILLARY
GLUCOSE-CAPILLARY: 116 mg/dL — AB (ref 65–99)
Glucose-Capillary: 152 mg/dL — ABNORMAL HIGH (ref 65–99)
Glucose-Capillary: 139 mg/dL — ABNORMAL HIGH (ref 65–99)

## 2015-07-08 LAB — CBC
HEMATOCRIT: 31.7 % — AB (ref 36.0–46.0)
HEMOGLOBIN: 9.8 g/dL — AB (ref 12.0–15.0)
MCH: 29.3 pg (ref 26.0–34.0)
MCHC: 30.9 g/dL (ref 30.0–36.0)
MCV: 94.6 fL (ref 78.0–100.0)
Platelets: 197 10*3/uL (ref 150–400)
RBC: 3.35 MIL/uL — ABNORMAL LOW (ref 3.87–5.11)
RDW: 12.9 % (ref 11.5–15.5)
WBC: 10 10*3/uL (ref 4.0–10.5)

## 2015-07-08 LAB — URINALYSIS, ROUTINE W REFLEX MICROSCOPIC
Bilirubin Urine: NEGATIVE
GLUCOSE, UA: NEGATIVE mg/dL
Hgb urine dipstick: NEGATIVE
KETONES UR: 15 mg/dL — AB
Leukocytes, UA: NEGATIVE
Nitrite: NEGATIVE
PH: 5.5 (ref 5.0–8.0)
PROTEIN: NEGATIVE mg/dL
Specific Gravity, Urine: 1.016 (ref 1.005–1.030)

## 2015-07-08 LAB — BASIC METABOLIC PANEL
Anion gap: 10 (ref 5–15)
BUN: 33 mg/dL — ABNORMAL HIGH (ref 6–20)
CO2: 23 mmol/L (ref 22–32)
CREATININE: 0.93 mg/dL (ref 0.44–1.00)
Calcium: 9.7 mg/dL (ref 8.9–10.3)
Chloride: 108 mmol/L (ref 101–111)
GFR calc Af Amer: 56 mL/min — ABNORMAL LOW (ref >60)
GFR calc non Af Amer: 48 mL/min — ABNORMAL LOW (ref >60)
Glucose, Bld: 150 mg/dL — ABNORMAL HIGH (ref 65–99)
POTASSIUM: 4.6 mmol/L (ref 3.5–5.1)
Sodium: 141 mmol/L (ref 135–145)

## 2015-07-08 LAB — SURGICAL PCR SCREEN
MRSA, PCR: NEGATIVE
Staphylococcus aureus: NEGATIVE

## 2015-07-08 LAB — PROTIME-INR
INR: 1.14 (ref 0.00–1.49)
Prothrombin Time: 14.8 seconds (ref 11.6–15.2)

## 2015-07-08 LAB — ABO/RH: ABO/RH(D): B POS

## 2015-07-08 LAB — CREATININE, SERUM
CREATININE: 1 mg/dL (ref 0.44–1.00)
GFR, EST AFRICAN AMERICAN: 51 mL/min — AB (ref >60)
GFR, EST NON AFRICAN AMERICAN: 44 mL/min — AB (ref >60)

## 2015-07-08 SURGERY — FIXATION, FRACTURE, INTERTROCHANTERIC, WITH INTRAMEDULLARY ROD
Anesthesia: Spinal | Laterality: Right

## 2015-07-08 MED ORDER — 0.9 % SODIUM CHLORIDE (POUR BTL) OPTIME
TOPICAL | Status: DC | PRN
  Administered 2015-07-08: 1000 mL

## 2015-07-08 MED ORDER — ENOXAPARIN SODIUM 30 MG/0.3ML ~~LOC~~ SOLN: 30.0000 mg | SUBCUTANEOUS | Status: DC

## 2015-07-08 MED ORDER — DEXTROSE 5 % IV SOLN
500.0000 mg | Freq: Four times a day (QID) | INTRAVENOUS | Status: DC | PRN
  Filled 2015-07-08: qty 5

## 2015-07-08 MED ORDER — ACETAMINOPHEN 650 MG RE SUPP
650.0000 mg | Freq: Four times a day (QID) | RECTAL | Status: DC | PRN
Start: 2015-07-08 — End: 2015-07-12

## 2015-07-08 MED ORDER — ACETAMINOPHEN 325 MG PO TABS
650.0000 mg | ORAL_TABLET | Freq: Four times a day (QID) | ORAL | Status: DC | PRN
  Administered 2015-07-09 – 2015-07-12 (×3): 650 mg via ORAL
  Filled 2015-07-08 (×3): qty 2

## 2015-07-08 MED ORDER — CEFAZOLIN SODIUM-DEXTROSE 2-4 GM/100ML-% IV SOLN
INTRAVENOUS | Status: AC
  Filled 2015-07-08: qty 100

## 2015-07-08 MED ORDER — FENTANYL CITRATE (PF) 250 MCG/5ML IJ SOLN
INTRAMUSCULAR | Status: DC | PRN
  Administered 2015-07-08: 25 ug via INTRAVENOUS

## 2015-07-08 MED ORDER — HYDROMORPHONE HCL 1 MG/ML IJ SOLN: 1.0000 mg | INTRAMUSCULAR | Status: DC | PRN

## 2015-07-08 MED ORDER — PHENYLEPHRINE HCL 10 MG/ML IJ SOLN
10.0000 mg | INTRAVENOUS | Status: DC | PRN
  Administered 2015-07-08: 25 ug/min via INTRAVENOUS

## 2015-07-08 MED ORDER — PHENYLEPHRINE 40 MCG/ML (10ML) SYRINGE FOR IV PUSH (FOR BLOOD PRESSURE SUPPORT)
PREFILLED_SYRINGE | INTRAVENOUS | Status: AC
  Filled 2015-07-08: qty 10

## 2015-07-08 MED ORDER — TRAMADOL HCL 50 MG PO TABS
50.0000 mg | ORAL_TABLET | Freq: Three times a day (TID) | ORAL | Status: DC | PRN
  Administered 2015-07-09 – 2015-07-12 (×6): 50 mg via ORAL
  Filled 2015-07-08 (×6): qty 1

## 2015-07-08 MED ORDER — CEFAZOLIN SODIUM-DEXTROSE 2-4 GM/100ML-% IV SOLN
2.0000 g | INTRAVENOUS | Status: AC
  Administered 2015-07-08: 2 g via INTRAVENOUS

## 2015-07-08 MED ORDER — ENOXAPARIN SODIUM 40 MG/0.4ML ~~LOC~~ SOLN
40.0000 mg | SUBCUTANEOUS | Status: DC
  Administered 2015-07-09 – 2015-07-12 (×4): 40 mg via SUBCUTANEOUS
  Filled 2015-07-08 (×4): qty 0.4

## 2015-07-08 MED ORDER — ONDANSETRON HCL 4 MG PO TABS: 4.0000 mg | ORAL_TABLET | Freq: Four times a day (QID) | ORAL | Status: DC | PRN

## 2015-07-08 MED ORDER — SUCCINYLCHOLINE CHLORIDE 20 MG/ML IJ SOLN
INTRAMUSCULAR | Status: DC | PRN
  Administered 2015-07-08: 60 mg via INTRAVENOUS

## 2015-07-08 MED ORDER — ONDANSETRON HCL 4 MG/2ML IJ SOLN
4.0000 mg | Freq: Four times a day (QID) | INTRAMUSCULAR | Status: DC | PRN
Start: 2015-07-08 — End: 2015-07-12

## 2015-07-08 MED ORDER — LIDOCAINE HCL (CARDIAC) 20 MG/ML IV SOLN
INTRAVENOUS | Status: DC | PRN
  Administered 2015-07-08: 80 mg via INTRAVENOUS

## 2015-07-08 MED ORDER — EPHEDRINE SULFATE 50 MG/ML IJ SOLN
INTRAMUSCULAR | Status: DC | PRN
  Administered 2015-07-08: 10 mg via INTRAVENOUS
  Administered 2015-07-08 (×2): 5 mg via INTRAVENOUS

## 2015-07-08 MED ORDER — HYDROMORPHONE HCL 1 MG/ML IJ SOLN
0.5000 mg | INTRAMUSCULAR | Status: DC | PRN
  Administered 2015-07-08: 0.5 mg via INTRAVENOUS
  Filled 2015-07-08: qty 1

## 2015-07-08 MED ORDER — SODIUM CHLORIDE 0.9 % IV SOLN
INTRAVENOUS | Status: DC
  Administered 2015-07-08: 13:00:00 via INTRAVENOUS

## 2015-07-08 MED ORDER — PROPOFOL 10 MG/ML IV BOLUS
INTRAVENOUS | Status: DC | PRN
  Administered 2015-07-08 (×2): 20 mg via INTRAVENOUS

## 2015-07-08 MED ORDER — METHOCARBAMOL 500 MG PO TABS
500.0000 mg | ORAL_TABLET | Freq: Four times a day (QID) | ORAL | Status: DC | PRN
  Administered 2015-07-08: 500 mg via ORAL
  Filled 2015-07-08: qty 1

## 2015-07-08 MED ORDER — ONDANSETRON HCL 4 MG/2ML IJ SOLN
INTRAMUSCULAR | Status: DC | PRN
  Administered 2015-07-08: 4 mg via INTRAVENOUS

## 2015-07-08 MED ORDER — LACTATED RINGERS IV SOLN
INTRAVENOUS | Status: DC
  Administered 2015-07-08 (×3): via INTRAVENOUS

## 2015-07-08 MED ORDER — MORPHINE SULFATE (PF) 2 MG/ML IV SOLN
2.0000 mg | Freq: Once | INTRAVENOUS | Status: AC
  Administered 2015-07-08: 2 mg via INTRAVENOUS
  Filled 2015-07-08: qty 1

## 2015-07-08 MED ORDER — ARTIFICIAL TEARS OP OINT
TOPICAL_OINTMENT | OPHTHALMIC | Status: AC
  Filled 2015-07-08: qty 3.5

## 2015-07-08 MED ORDER — SUCCINYLCHOLINE CHLORIDE 20 MG/ML IJ SOLN
INTRAMUSCULAR | Status: AC
  Filled 2015-07-08: qty 1

## 2015-07-08 MED ORDER — SODIUM CHLORIDE 0.9 % IV SOLN
INTRAVENOUS | Status: DC
  Administered 2015-07-08 – 2015-07-10 (×2): via INTRAVENOUS

## 2015-07-08 MED ORDER — ONDANSETRON HCL 4 MG/2ML IJ SOLN
INTRAMUSCULAR | Status: AC
  Filled 2015-07-08: qty 4

## 2015-07-08 MED ORDER — CHLORHEXIDINE GLUCONATE 4 % EX LIQD: 60.0000 mL | Freq: Once | CUTANEOUS | Status: DC

## 2015-07-08 MED ORDER — FENTANYL CITRATE (PF) 100 MCG/2ML IJ SOLN: 25.0000 ug | INTRAMUSCULAR | Status: DC | PRN

## 2015-07-08 MED ORDER — DOCUSATE SODIUM 100 MG PO CAPS: 100.0000 mg | ORAL_CAPSULE | Freq: Two times a day (BID) | ORAL | Status: DC

## 2015-07-08 MED ORDER — FENTANYL CITRATE (PF) 250 MCG/5ML IJ SOLN
INTRAMUSCULAR | Status: AC
  Filled 2015-07-08: qty 5

## 2015-07-08 MED ORDER — ONDANSETRON HCL 4 MG/2ML IJ SOLN: 4.0000 mg | Freq: Three times a day (TID) | INTRAMUSCULAR | Status: DC | PRN

## 2015-07-08 SURGICAL SUPPLY — 43 items
BIT DRILL 4.3MMS DISTAL GRDTED (BIT) IMPLANT
BLADE SURG 15 STRL LF DISP TIS (BLADE) ×1 IMPLANT
BLADE SURG 15 STRL SS (BLADE) ×3
CANISTER SUCT 3000ML PPV (MISCELLANEOUS) ×3 IMPLANT
CHLORAPREP W/TINT 26ML (MISCELLANEOUS) ×3 IMPLANT
COVER MAYO STAND STRL (DRAPES) ×5 IMPLANT
COVER PERINEAL POST (MISCELLANEOUS) ×3 IMPLANT
COVER SURGICAL LIGHT HANDLE (MISCELLANEOUS) ×3 IMPLANT
DRAPE STERI IOBAN 125X83 (DRAPES) ×3 IMPLANT
DRAPE U-SHAPE 47X51 STRL (DRAPES) ×3 IMPLANT
DRILL 4.3MMS DISTAL GRADUATED (BIT) ×3
DRSG MEPILEX BORDER 4X4 (GAUZE/BANDAGES/DRESSINGS) ×8 IMPLANT
DRSG MEPILEX BORDER 4X8 (GAUZE/BANDAGES/DRESSINGS) ×3 IMPLANT
ELECT REM PT RETURN 9FT ADLT (ELECTROSURGICAL) ×3
ELECTRODE REM PT RTRN 9FT ADLT (ELECTROSURGICAL) ×1 IMPLANT
GLOVE BIO SURGEON STRL SZ7 (GLOVE) ×3 IMPLANT
GLOVE BIO SURGEON STRL SZ8 (GLOVE) ×3 IMPLANT
GLOVE BIOGEL PI IND STRL 8 (GLOVE) ×1 IMPLANT
GLOVE BIOGEL PI INDICATOR 8 (GLOVE) ×2
GOWN STRL REUS W/ TWL LRG LVL3 (GOWN DISPOSABLE) ×1 IMPLANT
GOWN STRL REUS W/ TWL XL LVL3 (GOWN DISPOSABLE) ×2 IMPLANT
GOWN STRL REUS W/TWL LRG LVL3 (GOWN DISPOSABLE) ×3
GOWN STRL REUS W/TWL XL LVL3 (GOWN DISPOSABLE) ×6
HFN LAG SCREW 10.5MM X 85MM (Screw) ×2 IMPLANT
HFN RH 130 DEG 11MM X 360MM (Orthopedic Implant) ×2 IMPLANT
KIT BASIN OR (CUSTOM PROCEDURE TRAY) ×3 IMPLANT
KIT ROOM TURNOVER OR (KITS) ×3 IMPLANT
LINER BOOT UNIVERSAL DISP (MISCELLANEOUS) ×3 IMPLANT
NS IRRIG 1000ML POUR BTL (IV SOLUTION) ×3 IMPLANT
PACK GENERAL/GYN (CUSTOM PROCEDURE TRAY) ×3 IMPLANT
PAD ARMBOARD 7.5X6 YLW CONV (MISCELLANEOUS) ×6 IMPLANT
PIN GUIDE 3.2 903003004 (MISCELLANEOUS) ×2 IMPLANT
SCREW BONE CORTICAL 5.0X36 (Screw) ×2 IMPLANT
SCREW BONE CORTICAL 5.0X40 (Screw) ×2 IMPLANT
SCREWDRIVER HEX TIP 3.5MM (MISCELLANEOUS) ×2 IMPLANT
SPONGE LAP 18X18 X RAY DECT (DISPOSABLE) ×3 IMPLANT
STAPLER VISISTAT 35W (STAPLE) ×6 IMPLANT
SUT MNCRL AB 3-0 PS2 18 (SUTURE) ×3 IMPLANT
SUT VIC AB 0 CT1 27 (SUTURE) ×3
SUT VIC AB 0 CT1 27XBRD ANBCTR (SUTURE) ×1 IMPLANT
TRAY FOLEY CATH 16FRSI W/METER (SET/KITS/TRAYS/PACK) IMPLANT
WATER STERILE IRR 1000ML POUR (IV SOLUTION) ×3 IMPLANT
WIRE G INTRAMED 80X3.0 2810010 (MISCELLANEOUS) ×2 IMPLANT

## 2015-07-08 NOTE — Transfer of Care (Signed)
Immediate Anesthesia Transfer of Care Note  Patient: [name] Fields  Procedure(s) Performed: Procedure(s): INTRAMEDULLARY (IM) NAIL INTERTROCHANTRIC (Right)  Patient Location: PACU  Anesthesia Type:General  Level of Consciousness: awake and alert   Airway & Oxygen Therapy: Patient Spontanous Breathing and Patient connected to face mask oxygen  Post-op Assessment: Report given to RN and Post -op Vital signs reviewed and stable  Post vital signs: Reviewed and stable  Last Vitals:  Filed Vitals:   07/08/15 1404 07/08/15 1546  BP: 136/52 117/50  Pulse: 78 79  Temp:    Resp: 20     Complications: No apparent anesthesia complications

## 2015-07-08 NOTE — ED Notes (Signed)
Pt arrives from home via [facility]. Pt had a fall at 0300 this morning and her son placed her back in the bed. Upon awaking this morning the pt was still in pain so son called EMS. Obvious right femur deformity.

## 2015-07-08 NOTE — ED Provider Notes (Signed)
CSN: [id number]     Arrival date & time 07/08/15  [id number] History   First MD Initiated Contact with Patient 07/08/15 [phone]     No chief complaint on file.    (Consider location/radiation/quality/duration/timing/severity/associated sxs/prior Treatment) HPI Comments: Patient is a [age] year old female with past medical history of diabetes, hypertension, and pacemaker placement secondary to heart block. She presents today after a fall. She apparently got up in the night to go to the bathroom and was found on the floor this morning by her son. She is complaining of severe pain in her right hip/proximal femur. She denies other injury. She was transported here by EMS after receiving 100 g of fentanyl in route.  She also has a history of dementia and adds little additional useful history.  The history is provided by the patient.    Past Medical History  Diagnosis Date  . Diabetes mellitus   . Hypertension   . Second degree Mobitz II AV block     s/p PPM  . Arthritis   . Gout attack 03/2011    "for the first time; right wrist"   Past Surgical History  Procedure Laterality Date  . Cholecystectomy    . Pacemaker insertion  04/19/11    SJM Zephyr XL implanted by Dr Johney FrameAllred for mobitz II second degree AV block  . Back surgery      "have had 3 diskectomies"  . Cataract extraction, bilateral    . Permanent pacemaker insertion Bilateral 04/19/2011    Procedure: PERMANENT PACEMAKER INSERTION;  Surgeon: Hillis RangeJames Allred, MD;  [facility];  Service: Cardiovascular;  Laterality: Bilateral;   No family history on file. Social History  Substance Use Topics  . Smoking status: Never Smoker   . Smokeless tobacco: Never Used  . Alcohol Use: No   OB History    No data available     Review of Systems  Unable to perform ROS: Dementia      Allergies  Avelox; Micardis; Neurontin; Oxycodone hcl; and Pregabalin  Home Medications   Prior to Admission medications   Medication Sig Start Date End  Date Taking? Authorizing Provider  acetaminophen (TYLENOL) 650 MG CR tablet Take 650 mg by mouth every 8 (eight) hours as needed for pain.    Historical Provider, MD   There were no vitals taken for this visit. Physical Exam  Constitutional: She appears well-developed and well-nourished.  Patient is an elderly female who is somewhat cachectic. She is awake and alert and answers questions and follows commands appropriately.  HENT:  Head: Normocephalic and atraumatic.  Neck: Normal range of motion. Neck supple.  Cardiovascular: Normal rate, regular rhythm and normal heart sounds.   No murmur heard. Pulmonary/Chest: Effort normal and breath sounds normal. No respiratory distress. She has no wheezes. She has no rales.  Abdominal: Soft. Bowel sounds are normal. She exhibits no distension. There is no tenderness.  Musculoskeletal: Normal range of motion.  There is obvious deformity of the proximal femur/hip area. There is severe pain with any range of motion. She is able to wiggle her toes, sensation is intact to the foot, and DP pulses are palpable.  Neurological: She is alert. Coordination normal.  Skin: Skin is warm and dry.  Nursing note and vitals reviewed.   ED Course  Procedures (including critical care time) Labs Review Labs Reviewed  BASIC METABOLIC PANEL  CBC WITH DIFFERENTIAL/PLATELET  PROTIME-INR  URINALYSIS, ROUTINE W REFLEX MICROSCOPIC (NOT [facility])    Imaging Review No results  found. I have personally reviewed and evaluated these images and lab results as part of my medical decision-making.   EKG Interpretation   Date/Time:  Thursday July 08 2015 12:25:36 EDT Ventricular Rate:  74 PR Interval:  253 QRS Duration: 165 QT Interval:  447 QTC Calculation: 496 R Axis:   -59 Text Interpretation:  Atrial-sensed ventricular-paced rhythm No further  analysis attempted due to paced rhythm Confirmed by Wylder Macomber  MD, Jonne Rote  [phone]) on 07/08/2015 12:40:02 PM      MDM    Final diagnoses:  None    Patient brought by EMS after a fall at home. X-rays reveal a fracture of the proximal femur. This fracture is been discussed with Dr. Victorino Dike from orthopedics. He is recommending surgical repair. The patient will be admitted to the hospitalist service under the care of Dr. Randol Kern.    Geoffery Lyons, MD 07/08/15 1242

## 2015-07-08 NOTE — Anesthesia Procedure Notes (Signed)
Procedure Name: Intubation Date/Time: 07/08/2015 6:40 PM Performed by: Reine JustFLOWERS, Epimenio Schetter T Pre-anesthesia Checklist: Patient identified, Emergency Drugs available, Suction available, Patient being monitored and Timeout performed Patient Re-evaluated:Patient Re-evaluated prior to inductionOxygen Delivery Method: Circle system utilized and Simple face mask Preoxygenation: Pre-oxygenation with 100% oxygen Intubation Type: IV induction Ventilation: Mask ventilation without difficulty Laryngoscope Size: Mac and 3 Grade View: Grade I Tube type: Oral Tube size: 7.0 mm Number of attempts: 1 Airway Equipment and Method: Patient positioned with wedge pillow and Stylet Placement Confirmation: ETT inserted through vocal cords under direct vision,  positive ETCO2 and breath sounds checked- equal and bilateral Secured at: 20 cm Tube secured with: Tape Dental Injury: Teeth and Oropharynx as per pre-operative assessment

## 2015-07-08 NOTE — Brief Op Note (Signed)
07/08/2015  7:58 PM  PATIENT:  [name] Fields  82 y.o. female  PRE-OPERATIVE DIAGNOSIS:  Right femur fracture  POST-OPERATIVE DIAGNOSIS:  Right femur fracture  Procedure(s): Open treatment of right femur fracture with intramedullary nailing  SURGEON:  Toni ArthursJohn Acey Woodfield, MD  ASSISTANT: Joana ReamerJustin Ollis,PA-C  ANESTHESIA:   General  EBL:  50 cc  TOURNIQUET:  n/a  COMPLICATIONS:  None apparent  DISPOSITION:  Extubated, awake and stable to recovery.  DICTATION ID:  [id number]

## 2015-07-08 NOTE — Anesthesia Postprocedure Evaluation (Signed)
Anesthesia Post Note  Patient: Lelon FrohlichMargaret W Wong  Procedure(s) Performed: Procedure(s) (LRB): INTRAMEDULLARY (IM) NAIL INTERTROCHANTRIC (Right)  Patient location during evaluation: PACU Anesthesia Type: General Level of consciousness: awake and alert Pain management: pain level controlled Vital Signs Assessment: post-procedure vital signs reviewed and stable Respiratory status: spontaneous breathing, nonlabored ventilation, respiratory function stable and patient connected to nasal cannula oxygen Cardiovascular status: blood pressure returned to baseline and stable Postop Assessment: no signs of nausea or vomiting Anesthetic complications: no    Last Vitals:  Filed Vitals:   07/08/15 2043 07/08/15 2058  BP:  120/67  Pulse:  82  Temp: 36.6 C 36.3 C  Resp:  16    Last Pain:  Filed Vitals:   07/08/15 2059  PainSc: Asleep                 Amjad Fikes,JAMES TERRILL

## 2015-07-08 NOTE — ED Notes (Signed)
Foley site area appears fine. 

## 2015-07-08 NOTE — Op Note (Signed)
NAMGeorga Fields:  Fields, [name]              ACCOUNT NO.:  [id number]  MEDICAL RECORD NO.:  [id number]  LOCATION:  [id number]                        FACILITY:  [facility]  PHYSICIAN:  Toni ArthursJohn Enriqueta Augusta, MD        DATE OF BIRTH:  [birth year]  DATE OF PROCEDURE:  07/08/2015 DATE OF DISCHARGE:                              OPERATIVE REPORT   PREOPERATIVE DIAGNOSIS:  Right femur fracture.  POSTOPERATIVE DIAGNOSIS:  Right femur fracture.  PROCEDURE:  Open treatment of right femur fracture with intramedullary nailing.  SURGEON:  Toni ArthursJohn Rakesha Dalporto, MD.  ASSISTANT:  Alfredo MartinezJustin Ollis, PA-C.  ANESTHESIA:  General.  ESTIMATED BLOOD LOSS:  50 mL.  TOURNIQUET TIME:  0.  COMPLICATIONS:  None apparent.  DISPOSITION:  Extubated, awake, and stable to recovery.  INDICATION FOR PROCEDURE:  The patient is a 82 year old female with past medical history significant for heart block.  She fell at home early this morning while trying to go the bathroom.  She came to the emergency room, where radiographs revealed an oblique fracture of the proximal femoral shaft.  She presents now for operative treatment of this injury.  Her son, who is power of attorney understands the risks, benefits, the alternative treatment options and elects surgical treatment.  They specifically understand risks of bleeding, infection, nerve damage, blood clots, and death.  PROCEDURE IN DETAIL:  After preoperative consent was obtained and the correct operative site was identified, the patient was brought to the operating room supine on a stretcher.  General anesthesia was induced. Preoperative antibiotics were administered.  Surgical time-out was taken.  The patient was then moved onto the fracture table and all bony prominences were padded well.  The right lower extremity was placed in a traction boot.  The left lower extremity was placed in a scissor position on a padded foot holder.  Longitudinal traction was applied to the right lower extremity  through the traction boot.  AP and lateral radiograph showed appropriate reduction.  The right lower extremity was then prepped and draped in standard sterile fashion with a shower curtain.  A longitudinal incision was made at the tip of the greater trochanter.  Sharp dissection was carried down through skin and subcutaneous tissue.  The IT band was split in line with its fibers.  A guide pin was placed at the tip of the greater trochanter.  AP and lateral radiographs confirmed appropriate position, and it was advanced into the medullary canal.  The medullary canal was then opened with the reamer.  The guide pin was exchanged for a ball-tipped guidewire.  The guidewire could not successfully be passed across the fracture site.  A small incision was made lateral to the fracture site and sharp dissection was carried down through skin and subcutaneous tissues to the fracture site.  It was reduced manually and a clamp placed around the fracture site to hold it in a reduced position.  The guidewire was then successfully passed across the fracture site and advanced to the level of the superior pole of the patella.  AP and lateral radiographs confirmed appropriate reduction of the fracture and appropriate position of the guide pin.  The guide pin was then sequentially reamed to a  diameter of 12 mm.  A Biomet Affixus nail was then advanced over the guide pin and seated appropriately.  The guidewire was removed.  The targeting guide was used to insert an interlocking lag screw into the head of the femur in the center-center position.  This was locked to the nail proximally.  The traction was released and the fracture site was impacted.  AP and lateral radiographs confirmed appropriate reduction. The perfect circle technique was then used to insert 2 distal interlocking screws.  Final AP and lateral radiographs at the proximal end of the nail, the fracture site, and the distal end of the  nail showed appropriate position and length of all hardware and appropriate reduction of the fracture site.  The wounds were irrigated copiously. The IT band was closed with 0 Vicryl.  Subcutaneous tissues were approximated with inverted simple sutures of 2-0 Vicryl and 3-0 Monocryl.  The skin incisions were closed with staples.  Sterile dressings were applied.  The patient was awakened from anesthesia and transported to the recovery room in stable condition.  FOLLOWUP PLAN:  The patient will be weightbearing as tolerated on the right lower extremity.  She will be admitted to the Internal Medicine Service.  She will start DVT prophylaxis in the morning and Physical Therapy, Occupational Therapy, and Social Work consults will be obtained as well.  Alfredo Martinez, PA-C, was present and scrubbed for the duration of the case.  His assistance was essential in positioning the patient, prepping and draping, gaining and maintaining exposure, performing the operation, and closing and dressing the wounds.     Toni Arthurs, MD     JH/MEDQ  D:  07/08/2015  T:  07/08/2015  Job:  [id number]

## 2015-07-08 NOTE — Anesthesia Preprocedure Evaluation (Addendum)
Anesthesia Evaluation  Patient identified by MRN, date of birth, ID band  Airway Mallampati: II  TM Distance: <3 FB Neck ROM: Limited  Mouth opening: Limited Mouth Opening  Dental  (+) Edentulous Upper, Edentulous Lower   Pulmonary    breath sounds clear to auscultation       Cardiovascular hypertension, + dysrhythmias + pacemaker  Rhythm:Regular Rate:Normal     Neuro/Psych Mild dementia    GI/Hepatic   Endo/Other  diabetes  Renal/GU      Musculoskeletal  (+) Arthritis ,   Abdominal   Peds  Hematology  (+) anemia ,   Anesthesia Other Findings   Reproductive/Obstetrics                            Anesthesia Physical Anesthesia Plan  ASA: III  Anesthesia Plan: Spinal   Post-op Pain Management:    Induction: Intravenous  Airway Management Planned: Natural Airway  Additional Equipment:   Intra-op Plan:   Post-operative Plan: Extubation in OR  Informed Consent: I have reviewed the patients History and Physical, chart, labs and discussed the procedure including the risks, benefits and alternatives for the proposed anesthesia with the patient or authorized representative who has indicated his/her understanding and acceptance.   Dental advisory given  Plan Discussed with: CRNA and Surgeon  Anesthesia Plan Comments:        Anesthesia Quick Evaluation

## 2015-07-08 NOTE — ED Notes (Signed)
Admitting at bedside 

## 2015-07-08 NOTE — Discharge Instructions (Addendum)
Toni ArthursJohn Hewitt, MD [facility]  Please read the following information regarding your care after surgery.  Medications  You only need a prescription for the narcotic pain medicine (ex. oxycodone, Percocet, Norco).  All of the other medicines listed below are available over the counter. X acetominophen (Tylenol) 650 mg every 4-6 hours as you need for minor pain ?    X To help prevent blood clots, take a baby aspirin (81 mg) twice a day for two weeks after surgery.  You should also get up every hour while you are awake to move around.    Weight Bearing X Bear weight when you are able on your operated leg or foot with rolling walker.   Cast / Splint / Dressing X Keep your dressing clean and dry.  Do not remove your dressing.  It will be removed at your first post-op visit with Dr. Victorino DikeHewitt.    After your dressing, cast or splint is removed; you may shower, but do not soak or scrub the wound.  Allow the water to run over it, and then gently pat it dry.  Swelling It is normal for you to have swelling where you had surgery.  To reduce swelling and pain, keep your toes above your nose for at least 3 days after surgery.  It may be necessary to keep your foot or leg elevated for several weeks.  If it hurts, it should be elevated.  Follow Up Call my office at [phone] when you are discharged from the hospital or surgery center to schedule an appointment to be seen two weeks after surgery.  Call my office at [phone] if you develop a fever >101.5 F, nausea, vomiting, bleeding from the surgical site or severe pain.

## 2015-07-08 NOTE — H&P (Signed)
Patient Demographics  [name] Fields, is a [age] y.o. female  MRN: [id number]   DOB - [birth year]  Admit Date - 07/08/2015  Outpatient Primary MD for the patient is Ginette OttoSTONEKING,HAL THOMAS, MD   With History of -  Past Medical History  Diagnosis Date  . Diabetes mellitus   . Hypertension   . Second degree Mobitz II AV block     s/p PPM  . Arthritis   . Gout attack 03/2011    "for the first time; right wrist"      Past Surgical History  Procedure Laterality Date  . Cholecystectomy    . Pacemaker insertion  04/19/11    SJM Zephyr XL implanted by Dr Johney FrameAllred for mobitz II second degree AV block  . Back surgery      "have had 3 diskectomies"  . Cataract extraction, bilateral    . Permanent pacemaker insertion Bilateral 04/19/2011    Procedure: PERMANENT PACEMAKER INSERTION;  Surgeon: Hillis RangeJames Allred, MD;  [facility];  Service: Cardiovascular;  Laterality: Bilateral;    in for   Chief Complaint  Patient presents with  . Fall     HPI  [name] Fields  is a [age] y.o. female, With past medical history of hypertension, diabetes mellitus(currently off his medication as blood pressure and diabetes has been controlled), history of complete heart block in the past status post PPM, vision presents with fall, and right hip pain, apparently she fell overnight trying to go to the bathroom, she denies any syncope, lives with her son who put her on bed, this a.m. patient with severe right hip pain, in ED workup significant for right femur fracture, blood work with no significant abnormalities, patient denies shortness of breath, palpitation or chest pain, reports she has been more forgetful over last few month, I was called to admit.    Review of Systems    In addition to the HPI above,  No Fever-chills, No Headache, No changes with Vision or hearing, No problems swallowing food or Liquids, No Chest pain, Cough or Shortness of Breath, No Abdominal pain, No Nausea or Vommitting,  Bowel movements are regular, No Blood in stool or Urine, No dysuria, No new skin rashes or bruises, Right hip pain No new weakness, tingling, numbness in any extremity, No recent weight gain or loss, No polyuria, polydypsia or polyphagia, No significant Mental Stressors.  A full 10 point Review of Systems was done, except as stated above, all other Review of Systems were negative.   Social History Social History  Substance Use Topics  . Smoking status: Never Smoker   . Smokeless tobacco: Never Used  . Alcohol Use: No     Family History History reviewed. No pertinent family history.   Prior to Admission medications   Medication Sig Start Date End Date Taking? Authorizing Provider  acetaminophen (TYLENOL) 650 MG CR tablet Take 650 mg by mouth every 8 (eight) hours as needed for pain.    Historical Provider, MD    Allergies  Allergen Reactions  . Avelox [Moxifloxacin Hydrochloride]     Causes weakness  . Micardis [Telmisartan]     Causes hyperkalemia  . Neurontin [Gabapentin]     Causes dizziness  . Oxycodone Hcl     Causes dizziness  . Pregabalin     Causes dizziness    Physical Exam  Vitals  Blood pressure 135/63, pulse 73, temperature 97.4 F (36.3 C), temperature source Oral, resp. rate 22, SpO2 100 %.  1. General Frail, thin-appearing female lying in bed in NAD,   2.  Awake Alert, Oriented , communicative.  3. No F.N deficits, ALL C.Nerves Intact, right lower extremity internally rotated, shorter than the left, motor grossly intact except right lower extremity limited due to pain Plantars down going.  4. Ears and Eyes appear Normal, Conjunctivae clear, PERRLA. Moist Oral Mucosa.  5. Supple Neck, No JVD, No cervical lymphadenopathy appriciated, No Carotid Bruits.  6. Symmetrical Chest wall movement, Good air movement bilaterally, CTAB.  7. RRR, No Gallops, Rubs or Murmurs, No Parasternal Heave.  8. Positive Bowel Sounds, Abdomen Soft, No  tenderness, No organomegaly appriciated,No rebound -guarding or rigidity.  9.  No Cyanosis, Normal Skin Turgor, No Skin Rash or Bruise.  10. Good muscle tone,  right lower extremity shorter and internally rotate  11. No Palpable Lymph Nodes in Neck or Axillae    Data Review  CBC  Recent Labs Lab 07/08/15 1031  WBC 8.4  HGB 10.1*  HCT 31.6*  PLT 187  MCV 94.9  MCH 30.3  MCHC 32.0  RDW 13.0  LYMPHSABS 0.7  MONOABS 0.5  EOSABS 0.0  BASOSABS 0.0   ------------------------------------------------------------------------------------------------------------------  Chemistries   Recent Labs Lab 07/08/15 1031  NA 141  K 4.6  CL 108  CO2 23  GLUCOSE 150*  BUN 33*  CREATININE 0.93  CALCIUM 9.7   ------------------------------------------------------------------------------------------------------------------ CrCl cannot be calculated (Unknown ideal weight.). ------------------------------------------------------------------------------------------------------------------ No results for input(s): TSH, T4TOTAL, T3FREE, THYROIDAB in the last 72 hours.  Invalid input(s): FREET3   Coagulation profile  Recent Labs Lab 07/08/15 1031  INR 1.14   ------------------------------------------------------------------------------------------------------------------- No results for input(s): DDIMER in the last 72 hours. -------------------------------------------------------------------------------------------------------------------  Cardiac Enzymes No results for input(s): CKMB, TROPONINI, MYOGLOBIN in the last 168 hours.  Invalid input(s): CK ------------------------------------------------------------------------------------------------------------------ Invalid input(s): POCBNP   ---------------------------------------------------------------------------------------------------------------  Urinalysis    Component Value Date/Time   COLORURINE YELLOW 11/22/2012  0652   APPEARANCEUR CLEAR 11/22/2012 0652   LABSPEC 1.009 11/22/2012 0652   PHURINE 6.5 11/22/2012 0652   GLUCOSEU NEGATIVE 11/22/2012 0652   HGBUR NEGATIVE 11/22/2012 0652   BILIRUBINUR NEGATIVE 11/22/2012 0652   KETONESUR NEGATIVE 11/22/2012 0652   PROTEINUR NEGATIVE 11/22/2012 0652   UROBILINOGEN 0.2 11/22/2012 0652   NITRITE NEGATIVE 11/22/2012 0652   LEUKOCYTESUR LARGE* 11/22/2012 0652    ----------------------------------------------------------------------------------------------------------------  Imaging results:   Dg Chest 1 View  07/08/2015  CLINICAL DATA:  Fall this morning with right hip pain EXAM: CHEST 1 VIEW COMPARISON:  11/26/2013 FINDINGS: Cardiac shadow is stable. Pacing device is again seen. The lungs are well aerated bilaterally. No acute bony abnormality is noted. Healed left clavicular fracture is seen. IMPRESSION: No active disease. Electronically Signed   By: Alcide Clever M.D.   On: 07/08/2015 10:43   Dg Hip Unilat With Pelvis 2-3 Views Right  07/08/2015  CLINICAL DATA:  Right hip pain, fall at 3 a.m. this morning EXAM: DG HIP (WITH OR WITHOUT PELVIS) 2-3V RIGHT COMPARISON:  None. FINDINGS: Three views of the right hip submitted. There is diffuse osteopenia. There is displaced angulated spiral fracture in proximal shaft of the right femur. There is soft tissue swelling proximal thigh region. No hip dislocation. IMPRESSION: Displaced angulated fracture proximal shaft of the right femur. Soft tissue swelling proximal thigh region. Electronically Signed   By: Natasha Mead M.D.   On: 07/08/2015 10:24   Dg Femur, Min 2 Views Right  07/08/2015  CLINICAL DATA:  Pain following fall EXAM: RIGHT FEMUR  2 VIEWS COMPARISON:  None. FINDINGS: Frontal and lateral views were obtained. There is an obliquely oriented fracture of the proximal femoral diaphysis with posterior displacement and medial angulation of the distal fracture fragment with respect proximal fragment. Bones are  osteoporotic. No other fractures. No dislocation. There is osteoarthritic change in the right femur and knee regions. There is extensive arterial vascular calcification in the right thigh region. IMPRESSION: Obliquely oriented fracture proximal right femoral diaphysis with displacement angulation of fracture fragments. Bones osteoporotic. No dislocation. Arthropathy noted in the right hip and knee joints. Arterial vascular calcification noted. Electronically Signed   By: Bretta Bang III M.D.   On: 07/08/2015 10:40       Assessment & Plan  Active Problems:   Essential hypertension   Complete heart block (HCC)   Femur fracture, right (HCC)   Right femur fracture - Status post mechanical fall, does not appear to be associated to syncope or presyncope. - Patient does not appear to be in malignant arrhythmia, currently her heart rate is a paced, has no dyspnea, or chest pain, no evidence of CHF, patient is moderate risk for surgical intervention given her age, no further workup needed. Surgery. - Discussed at length with son, he understand risk of surgery given her age.  Complete heart block - Status post permanent pacemaker, currently her rhythm is paced is on telemetry monitor, will obtain EKG, no syncope, presyncope or lightheadedness  History of hypertension and diabetes mellitus in the past - Both appear to be stable, her medication has been stopped by PCP for some time now.  DVT Prophylaxis  Lovenox   AM Labs Ordered, also please review Full Orders  Family Communication: Admission, patients condition and plan of care including tests being ordered have been discussed with the patient and son who indicate understanding and agree with the plan and Code Status.  Code Status DNR, Homero Fellers by son at bedside, reports she had DO NOT RESUSCITATE and living will at home  Likely DC to :SNF  Condition GUARDED   Time spent in minutes : 55 minutes    ELGERGAWY, DAWOOD M.D on 07/08/2015  at 12:51 PM  Between 7am to 7pm - Pager - [phone]  After 7pm go to [email or web address] - password [email or web address]  And look for the night coverage person covering me after hours  [facility] Office  [phone]

## 2015-07-08 NOTE — Consult Note (Signed)
Reason for Consult: Right proximal displaced femoral shaft fx Referring Physician: Geoffery Lyons, MD  [name] Fields is an 82 y.o. female.  HPI: [name] Fields is a 82 year old female, with PMH listed below, that presented via EMS to the [facility] ED on 07/08/15 with c/o R hip pain and deformity.  [name] reports that the patient was lying in the floor beside her bed at 3:00 am.  She c/o of sharp R hip.  She reports that she simply fell while walking next to her bed.   He put her back to bed, and she continued to c/o of R hip pain later that same morning.  Her son then called EMS and she was transported to [facility] ED.  Radiographs were obtained that demonstrate a R proximally displaced femoral shaft fx.  Dr. Toni Arthurs was then consulted.  The patient reports that NWB and keeping her leg still makes her hip feel better, while moving her hip exacerbates her symptoms.  She currently describes the pain as throbby. She denies any previous injury or surgery to this hip previously.  She denies history of thyroid disease, blood clots, cancers, and she is a nonsmoker.  The son states that his mother is very active and gets around well with her walker.  She does not take any medications for any diagnoses.  Surgical considerations:  Hx of DM, HTN, AV block and pacemaker insertion.  Past Medical History  Diagnosis Date  . Diabetes mellitus   . Hypertension   . Second degree Mobitz II AV block     s/p PPM  . Arthritis   . Gout attack 03/2011    "for the first time; right wrist"    Past Surgical History  Procedure Laterality Date  . Cholecystectomy    . Pacemaker insertion  04/19/11    SJM Zephyr XL implanted by Dr Johney Frame for mobitz II second degree AV block  . Back surgery      "have had 3 diskectomies"  . Cataract extraction, bilateral    . Permanent pacemaker insertion Bilateral 04/19/2011    Procedure: PERMANENT PACEMAKER INSERTION;  Surgeon: Hillis Range, MD;  Location: [facility] CATH LAB;  Service:  Cardiovascular;  Laterality: Bilateral;    History reviewed. No pertinent family history.  Social History:  reports that she has never smoked. She has never used smokeless tobacco. She reports that she does not drink alcohol or use illicit drugs.  Allergies:  Allergies  Allergen Reactions  . Avelox [Moxifloxacin Hydrochloride]     Causes weakness  . Micardis [Telmisartan]     Causes hyperkalemia  . Neurontin [Gabapentin]     Causes dizziness  . Oxycodone Hcl     Causes dizziness  . Pregabalin     Causes dizziness    Medications: I have reviewed the patient's current medications.  Results for orders placed or performed during the hospital encounter of 07/08/15 (from the past 48 hour(s))  Basic metabolic panel     Status: Abnormal   Collection Time: 07/08/15 10:31 AM  Result Value Ref Range   Sodium 141 135 - 145 mmol/L   Potassium 4.6 3.5 - 5.1 mmol/L   Chloride 108 101 - 111 mmol/L   CO2 23 22 - 32 mmol/L   Glucose, Bld 150 (H) 65 - 99 mg/dL   BUN 33 (H) 6 - 20 mg/dL   Creatinine, Ser 5.74 0.44 - 1.00 mg/dL   Calcium 9.7 8.9 - 93.5 mg/dL   GFR calc non Af Denyse Dago  48 (L) >60 mL/min   GFR calc Af Amer 56 (L) >60 mL/min    Comment: (NOTE) The eGFR has been calculated using the CKD EPI equation. This calculation has not been validated in all clinical situations. eGFR's persistently <60 mL/min signify possible Chronic Kidney Disease.    Anion gap 10 5 - 15  CBC with Differential     Status: Abnormal   Collection Time: 07/08/15 10:31 AM  Result Value Ref Range   WBC 8.4 4.0 - 10.5 K/uL   RBC 3.33 (L) 3.87 - 5.11 MIL/uL   Hemoglobin 10.1 (L) 12.0 - 15.0 g/dL   HCT 31.6 (L) 36.0 - 46.0 %   MCV 94.9 78.0 - 100.0 fL   MCH 30.3 26.0 - 34.0 pg   MCHC 32.0 30.0 - 36.0 g/dL   RDW 13.0 11.5 - 15.5 %   Platelets 187 150 - 400 K/uL   Neutrophils Relative % 86 %   Neutro Abs 7.2 1.7 - 7.7 K/uL   Lymphocytes Relative 8 %   Lymphs Abs 0.7 0.7 - 4.0 K/uL   Monocytes Relative 6 %    Monocytes Absolute 0.5 0.1 - 1.0 K/uL   Eosinophils Relative 0 %   Eosinophils Absolute 0.0 0.0 - 0.7 K/uL   Basophils Relative 0 %   Basophils Absolute 0.0 0.0 - 0.1 K/uL  Protime-INR     Status: None   Collection Time: 07/08/15 10:31 AM  Result Value Ref Range   Prothrombin Time 14.8 11.6 - 15.2 seconds   INR 1.14 0.00 - 1.49    Dg Chest 1 View  07/08/2015  CLINICAL DATA:  Fall this morning with right hip pain EXAM: CHEST 1 VIEW COMPARISON:  11/26/2013 FINDINGS: Cardiac shadow is stable. Pacing device is again seen. The lungs are well aerated bilaterally. No acute bony abnormality is noted. Healed left clavicular fracture is seen. IMPRESSION: No active disease. Electronically Signed   By: Inez Catalina M.D.   On: 07/08/2015 10:43   Dg Hip Unilat With Pelvis 2-3 Views Right  07/08/2015  CLINICAL DATA:  Right hip pain, fall at 3 a.m. this morning EXAM: DG HIP (WITH OR WITHOUT PELVIS) 2-3V RIGHT COMPARISON:  None. FINDINGS: Three views of the right hip submitted. There is diffuse osteopenia. There is displaced angulated spiral fracture in proximal shaft of the right femur. There is soft tissue swelling proximal thigh region. No hip dislocation. IMPRESSION: Displaced angulated fracture proximal shaft of the right femur. Soft tissue swelling proximal thigh region. Electronically Signed   By: Lahoma Crocker M.D.   On: 07/08/2015 10:24   Dg Femur, Min 2 Views Right  07/08/2015  CLINICAL DATA:  Pain following fall EXAM: RIGHT FEMUR 2 VIEWS COMPARISON:  None. FINDINGS: Frontal and lateral views were obtained. There is an obliquely oriented fracture of the proximal femoral diaphysis with posterior displacement and medial angulation of the distal fracture fragment with respect proximal fragment. Bones are osteoporotic. No other fractures. No dislocation. There is osteoarthritic change in the right femur and knee regions. There is extensive arterial vascular calcification in the right thigh region.  IMPRESSION: Obliquely oriented fracture proximal right femoral diaphysis with displacement angulation of fracture fragments. Bones osteoporotic. No dislocation. Arthropathy noted in the right hip and knee joints. Arterial vascular calcification noted. Electronically Signed   By: Lowella Grip III M.D.   On: 07/08/2015 10:40    ROS:   Gen: Denies fever, chills, weight change, fatigue, night sweats MSK: : C/o or Right hip pain  with movement of her leg Neuro: Denies headache, numbness, weakness, slurred speech, loss of memory or consciousness Derm: Denies rash, dry skin, scaling or peeling skin change HEENT: Denies blurred vision, double vision, neck stiffness, dysphagia PULM: Denies shortness of breath, cough, sputum production, hemoptysis, wheezing CV: Denies chest pain, edema, orthopnea, palpitations GI: Denies abdominal pain, nausea, vomiting, diarrhea, hematochezia, melena, constipation  Endocrine: Denies hot or cold intolerance, polyuria, polyphagia or appetite change Heme: Denies easy bruising, bleeding, bleeding gums  PE:  General: WDWN patient in NAD. Psych:  Appropriate mood and affect. Neuro:  A&O x 3 although patient is somewhat cacectic, Moving all extremities, sensation intact to light touch HEENT:  EOMs intact Chest:  Even non-labored respirations Skin:  Skin C/D/I, no rashes or lesions Extremities: warm/dry, mild edema, no erythmea or echymosis.  No lymphadenopathy. Pulses: Femoral 2+ MSK:  Obvious R hip deformity noted.  ROM: Hip motion not assessed.  Patient has full ankle ROM, MMT: patient can perform quad set, (-) Homan's  Blood pressure 127/50, pulse 65, temperature 97.4 F (36.3 C), temperature source Oral, resp. rate 20, SpO2 100 %.   Assessment/Plan: Right proximally displaced femoral fx  This is a condition that requires surgical intervention if the patient wants to be able to walk again.  After explaining risks and benefits of surgery to the patient and  son, including bleeding, infection, blood clots, need for additional surgery, nonunion, and even death, the patient and son have agreed to undergo R femoral IM nailing to be performed by Dr. Wylene Simmer this afternoon.  Patient is to remain NPO She is not on any blood thinner, and they are to be held until after surgery. NWB RLE  Mechele Claude, PA-C, [facility] Office:  [phone]

## 2015-07-09 ENCOUNTER — Encounter (HOSPITAL_COMMUNITY): Payer: Self-pay | Admitting: Orthopedic Surgery

## 2015-07-09 HISTORY — PX: FEMUR SURGERY: SHX943

## 2015-07-09 MED ORDER — ASPIRIN EC 81 MG PO TBEC: 81.0000 mg | DELAYED_RELEASE_TABLET | Freq: Two times a day (BID) | ORAL | Status: DC

## 2015-07-09 NOTE — Clinical Social Work Placement (Signed)
   CLINICAL SOCIAL WORK PLACEMENT  NOTE  Date:  07/09/2015  Patient Details  Name: [name] Fields MRN: [id number] Date of Birth: 06-Mar-1911  Clinical Social Work is seeking post-discharge placement for this patient at the Skilled  Nursing Facility level of care (*CSW will initial, date and re-position this form in  chart as items are completed):  Yes   Patient/family provided with [place] Clinical Social Work Department's list of facilities offering this level of care within the geographic area requested by the patient (or if unable, by the patient's family).  Yes   Patient/family informed of their freedom to choose among providers that offer the needed level of care, that participate in Medicare, Medicaid or managed care program needed by the patient, have an available bed and are willing to accept the patient.  Yes   Patient/family informed of [facility]'s ownership interest [facility] and [facility], as well as of the fact that they are under no obligation to receive care at these facilities.  PASRR submitted to EDS on       PASRR number received on       Existing PASRR number confirmed on 07/09/15     FL2 transmitted to all facilities in geographic area requested by pt/family on 07/09/15     FL2 transmitted to all facilities within larger geographic area on       Patient informed that his/her managed care company has contracts with or will negotiate with certain facilities, including the following:            Patient/family informed of bed offers received.  Patient chooses bed at       Physician recommends and patient chooses bed at      Patient to be transferred to   on  .  Patient to be transferred to facility by       Patient family notified on   of transfer.  Name of family member notified:        PHYSICIAN Please prepare prescriptions, Please sign FL2     Additional Comment:    _______________________________________________ Rod MaeVaughn, Royelle Hinchman  S, LCSW 07/09/2015, 11:04 AM

## 2015-07-09 NOTE — Progress Notes (Signed)
[facility] PROGRESS NOTE  [name] Fields JXB:[id number] DOB: [birth year] DOA: 07/08/2015 PCP: Ginette Otto, MD  Assessment/Plan:  Right femur fracture Status post intramedullary intertrochanteric nail -Like a mechanical fall.  Complete heart block - Status post permanent pacemaker, currently her rhythm is paced is on telemetry monitor  History of hypertension and diabetes mellitus in the past - Both appear to be stable, her medication has been stopped by PCP for some time now.  DVT Prophylaxis Lovenox   Code Status:  Full code Family Communication: No family present at bedside Disposition Plan: Skilled nursing facility   Consultants:  Orthopedics  Procedures:  None  Antibiotics:  None  HPI/Subjective: 82 y.o. female, With past medical history of hypertension, diabetes mellitus(currently off his medication as blood pressure and diabetes has been controlled), history of complete heart block in the past status post PPM, vision presents with fall, and right hip pain, apparently she fell overnight trying to go to the bathroom, she denies any syncope, lives with her son who put her on bed, this a.m. patient with severe right hip pain, in ED workup significant for right femur fracture, blood work with no significant abnormalities, patient denies shortness of breath, palpitation or chest pain, reports she has been more forgetful over last few month, I was called to admit.  Patient denies any complexes morning. Status post surgery.  Objective: Filed Vitals:   07/09/15 0934 07/09/15 1405  BP:  107/47  Pulse: 89 73  Temp:  98.7 F (37.1 C)  Resp:  17    Intake/Output Summary (Last 24 hours) at 07/09/15 1439 Last data filed at 07/09/15 0700  Gross per 24 hour  Intake   1865 ml  Output    460 ml  Net   1405 ml   There were no vitals filed for this visit.  Exam:   General:  Appears in no acute distress  Cardiovascular: S1-S2 regular  Respiratory:  Clear to auscultation bilaterally  Abdomen: Soft, nontender, no organomegaly  Musculoskeletal: No cyanosis/clubbing/edema of the lower extremities   Data Reviewed: Basic Metabolic Panel:  Recent Labs Lab 07/08/15 1031 07/08/15 1409  NA 141  --   K 4.6  --   CL 108  --   CO2 23  --   GLUCOSE 150*  --   BUN 33*  --   CREATININE 0.93 1.00  CALCIUM 9.7  --    CBC:  Recent Labs Lab 07/08/15 1031 07/08/15 1409  WBC 8.4 10.0  NEUTROABS 7.2  --   HGB 10.1* 9.8*  HCT 31.6* 31.7*  MCV 94.9 94.6  PLT 187 197   CBG:  Recent Labs Lab 07/08/15 1510 07/08/15 1743 07/08/15 2026  GLUCAP 152* 139* 116*    Recent Results (from the past 240 hour(s))  Surgical pcr screen     Status: None   Collection Time: 07/08/15  2:15 PM  Result Value Ref Range Status   MRSA, PCR NEGATIVE NEGATIVE Final   Staphylococcus aureus NEGATIVE NEGATIVE Final    Comment:        The Xpert SA Assay (FDA approved for NASAL specimens in patients over 71 years of age), is one component of a comprehensive surveillance program.  Test performance has been validated by [facility] for patients greater than or equal to 45 year old. It is not intended to diagnose infection nor to guide or monitor treatment.      Studies: Dg Chest 1 View  07/08/2015  CLINICAL DATA:  Fall this morning  with right hip pain EXAM: CHEST 1 VIEW COMPARISON:  11/26/2013 FINDINGS: Cardiac shadow is stable. Pacing device is again seen. The lungs are well aerated bilaterally. No acute bony abnormality is noted. Healed left clavicular fracture is seen. IMPRESSION: No active disease. Electronically Signed   By: Alcide Clever M.D.   On: 07/08/2015 10:43   Dg C-arm 1-60 Min  07/08/2015  CLINICAL DATA:  ORIF of right femur. EXAM: DG C-ARM 61-120 MIN; RIGHT FEMUR 2 VIEWS COMPARISON:  07/08/2015 right femur radiographs FLUOROSCOPY TIME:  C-arm fluoroscopic images were obtained intraoperatively and submitted for post operative  interpretation. Please see the performing provider's procedural report for the fluoroscopy time utilized. FINDINGS: Five intraoperative spot fluoroscopic images of the right femur are provided. These demonstrate interval reduction of the proximal femoral diaphysis fracture, which is now in essentially anatomic alignment. An antegrade intramedullary nail has been placed across the fracture with proximal and distal screws in place. IMPRESSION: Intraoperative images during ORIF of right femur fracture. Electronically Signed   By: Sebastian Ache M.D.   On: 07/08/2015 20:33   Dg Hip Unilat With Pelvis 2-3 Views Right  07/08/2015  CLINICAL DATA:  Right hip pain, fall at 3 a.m. this morning EXAM: DG HIP (WITH OR WITHOUT PELVIS) 2-3V RIGHT COMPARISON:  None. FINDINGS: Three views of the right hip submitted. There is diffuse osteopenia. There is displaced angulated spiral fracture in proximal shaft of the right femur. There is soft tissue swelling proximal thigh region. No hip dislocation. IMPRESSION: Displaced angulated fracture proximal shaft of the right femur. Soft tissue swelling proximal thigh region. Electronically Signed   By: Natasha Mead M.D.   On: 07/08/2015 10:24   Dg Femur, Min 2 Views Right  07/08/2015  CLINICAL DATA:  ORIF of right femur. EXAM: DG C-ARM 61-120 MIN; RIGHT FEMUR 2 VIEWS COMPARISON:  07/08/2015 right femur radiographs FLUOROSCOPY TIME:  C-arm fluoroscopic images were obtained intraoperatively and submitted for post operative interpretation. Please see the performing provider's procedural report for the fluoroscopy time utilized. FINDINGS: Five intraoperative spot fluoroscopic images of the right femur are provided. These demonstrate interval reduction of the proximal femoral diaphysis fracture, which is now in essentially anatomic alignment. An antegrade intramedullary nail has been placed across the fracture with proximal and distal screws in place. IMPRESSION: Intraoperative images during  ORIF of right femur fracture. Electronically Signed   By: Sebastian Ache M.D.   On: 07/08/2015 20:33   Dg Femur, Min 2 Views Right  07/08/2015  CLINICAL DATA:  Pain following fall EXAM: RIGHT FEMUR 2 VIEWS COMPARISON:  None. FINDINGS: Frontal and lateral views were obtained. There is an obliquely oriented fracture of the proximal femoral diaphysis with posterior displacement and medial angulation of the distal fracture fragment with respect proximal fragment. Bones are osteoporotic. No other fractures. No dislocation. There is osteoarthritic change in the right femur and knee regions. There is extensive arterial vascular calcification in the right thigh region. IMPRESSION: Obliquely oriented fracture proximal right femoral diaphysis with displacement angulation of fracture fragments. Bones osteoporotic. No dislocation. Arthropathy noted in the right hip and knee joints. Arterial vascular calcification noted. Electronically Signed   By: Bretta Bang III M.D.   On: 07/08/2015 10:40    Scheduled Meds: . enoxaparin (LOVENOX) injection  40 mg Subcutaneous Q24H   Continuous Infusions: . sodium chloride 75 mL/hr at 07/08/15 2200    Active Problems:   Essential hypertension   Complete heart block (HCC)   Femur fracture, right (HCC)  Time spent: 25 min    [facility] The[facility],Ellason Segar S  [facility] Pager [phone]. If 7PM-7AM, please contact night-coverage at [email or web address], password [email or web address] 07/09/2015, 2:39 PM  LOS: 1 day

## 2015-07-09 NOTE — Evaluation (Signed)
Physical Therapy Evaluation Patient Details Name: [name] Fields MRN: [id number] DOB: 06-07-1910 Today's Date: 07/09/2015   History of Present Illness  Pt admitted after fall at home with Right femur s/p IM nail. PMHx: DM, HTN, gout, arthritis (dementia per son)  Clinical Impression  Pt pleasant but confused, unaware she had surgery, unable to consistently follow commands and HOH. Per son pt was walking with RW and caring for herself at home including sponge bathing. FAmily is not present 24hrs/day and agreeable to ST-SNF. Pt with decreased strength, cognition, functional mobility and balance who will benefit from acute therapy to maximize strength and transfers to decrease burden of care.     Follow Up Recommendations SNF;Supervision/Assistance - 24 hour    Equipment Recommendations  None recommended by PT    Recommendations for Other Services       Precautions / Restrictions Precautions Precautions: Fall Restrictions Weight Bearing Restrictions: Yes RLE Weight Bearing: Weight bearing as tolerated      Mobility  Bed Mobility Overal bed mobility: Needs Assistance;+2 for physical assistance Bed Mobility: Supine to Sit     Supine to sit: Max assist;+2 for physical assistance     General bed mobility comments: pt with limited assist moving bil LE toward EOB, max assist to lift and scoot pelvis toward EOB, minimal assist from pt to elevate trunk   Transfers Overall transfer level: Needs assistance   Transfers: Squat Pivot Transfers     Squat pivot transfers: Total assist;+2 physical assistance     General transfer comment: pt with use of pad and belt to assist pt to semistand from bed and pivot to chair. Pt maintains crouched posture throughout  Ambulation/Gait                Stairs            Wheelchair Mobility    Modified Rankin (Stroke Patients Only)       Balance Overall balance assessment: Needs assistance   Sitting balance-Leahy Scale:  Poor       Standing balance-Leahy Scale: Zero                               Pertinent Vitals/Pain Pain Assessment:  (PAINAD= 3)    Home Living Family/patient expects to be discharged to:: Private residence Living Arrangements: Children Available Help at Discharge: Family;Available 24 hours/day Type of Home: House Home Access: Ramped entrance     Home Layout: One level Home Equipment: Walker - 2 wheels;Shower seat;Walker - 4 wheels Additional Comments: son is present grossly 20hrs a day    Prior Function Level of Independence: Needs assistance   Gait / Transfers Assistance Needed: pt uses a RW for home mobility  ADL's / Homemaking Assistance Needed: sponge bathes, family does homemaking  Comments: per family pt has Alzheimers and is most confused in AM, family states she has a poor appetite and drinks 2 boost drinks per day preferably chocolate     Hand Dominance        Extremity/Trunk Assessment   Upper Extremity Assessment: Generalized weakness           Lower Extremity Assessment: Generalized weakness      Cervical / Trunk Assessment: Kyphotic  Communication   Communication: HOH  Cognition Arousal/Alertness: Awake/alert Behavior During Therapy: Flat affect Overall Cognitive Status: Impaired/Different from baseline Area of Impairment: Orientation;Attention;Memory;Following commands;Safety/judgement;Problem solving Orientation Level: Disoriented to;Time;Situation;Person Current Attention Level: Focused Memory: Decreased short-term memory Following  Commands: Follows one step commands inconsistently;Follows one step commands with increased time Safety/Judgement: Decreased awareness of safety;Decreased awareness of deficits   Problem Solving: Slow processing;Decreased initiation;Difficulty sequencing;Requires verbal cues;Requires tactile cues General Comments: pt unaware that she had surgery, unable to recall if she had breakfast even with full  tray sitting in front of her, unable to follow commands for mobility consistently    General Comments      Exercises General Exercises - Lower Extremity Long Arc Quad: Right;10 reps;Seated;AROM Hip Flexion/Marching: AROM;Right;10 reps;Seated      Assessment/Plan    PT Assessment Patient needs continued PT services  PT Diagnosis Difficulty walking;Generalized weakness;Acute pain;Altered mental status   PT Problem List Decreased strength;Decreased cognition;Decreased range of motion;Decreased activity tolerance;Decreased balance;Decreased mobility;Decreased coordination;Pain;Decreased safety awareness;Decreased skin integrity;Decreased knowledge of use of DME  PT Treatment Interventions DME instruction;Gait training;Functional mobility training;Therapeutic activities;Therapeutic exercise;Balance training;Patient/family education;Cognitive remediation   PT Goals (Current goals can be found in the Care Plan section) Acute Rehab PT Goals PT Goal Formulation: Patient unable to participate in goal setting Time For Goal Achievement: 07/23/15 Potential to Achieve Goals: Fair    Frequency Min 3X/week   Barriers to discharge Decreased caregiver support      Co-evaluation               End of Session Equipment Utilized During Treatment: Gait belt Activity Tolerance: Patient tolerated treatment well Patient left: in chair;with call bell/phone within reach;with chair alarm set;with nursing/sitter in room Nurse Communication: Mobility status;Precautions;Weight bearing status         Time: 0902-0925 PT Time Calculation (min) (ACUTE ONLY): 23 min   Charges:   PT Evaluation $PT Eval Moderate Complexity: 1 Procedure PT Treatments $Therapeutic Activity: 8-22 mins   PT G CodesDelorse Fields:        [name] Fields, [name] Fields 07/09/2015, 9:40 AM [name] Fields, PT [phone]

## 2015-07-09 NOTE — Clinical Social Work Note (Signed)
Clinical Social Work Assessment  Patient Details  Name: [name] FrohlichMargaret W Cotham MRN: [id number] Date of Birth: 07-19-1910  Date of referral:  07/09/15               Reason for consult:  Discharge Planning                Permission sought to share information with:  Facility Medical sales representativeContact Representative, Family Supports Permission granted to share information::  No (Patient oriented to self only.)  Name::     [name] HansonJospeh Fields  [facility]  Relationship::  Son  Contact Information:  cell: [phone], home: [phone]  Housing/Transportation Living arrangements for the past 2 months:  Single Family Home Source of Information:  Adult Children Patient Interpreter Needed:  None Criminal Activity/Legal Involvement Pertinent to Current Situation/Hospitalization:  No - Comment as needed Significant Relationships:  Adult Children Lives with:  Adult Children Do you feel safe going back to the place where you live?  Yes Need for family participation in patient care:  Yes (Comment) ([name] active in patient's care.)  Care giving concerns:  [name] expressed no concerns at this time.   Social Worker assessment / plan:  CSW received referral for possible SNF placement at time of discharge. CSW spoke with patient's son, [name] Fields, who informed CSW that patient's family was anticipating a SNF recommendation from PT. Per patient's son, patient has been living with patient's son who is the primary caregiver for patient. At this time, patient's son does not have a SNF preference but [place]. CSW to continue to follow and assist with discharge planning needs.  Employment status:  Retired Health and safety inspectornsurance information:  Teacher, English as a foreign languageManaged Medicare (Blue Medicare [place]) PT Recommendations:  Skilled Nursing Facility Information / Referral to community resources:  Skilled Nursing Facility  Patient/Family's Response to care:  [name] understanding and agreeable to CSW plan of  care.  Patient/Family's Understanding of and Emotional Response to Diagnosis, Current Treatment, and Prognosis:  [name] understanding of PT recommendation and hopeful for patient to be able to return [facility].  Emotional Assessment Appearance:  Other (Comment Required (CSW spoke with patient's son as patient is oriented to self.) Attitude/Demeanor/Rapport:  Other (CSW spoke with patient's son as patient is oriented to self.) Affect (typically observed):  Other (CSW spoke with patient's son as patient is oriented to self.) Orientation:  Oriented to Self Alcohol / Substance use:  Not Applicable Psych involvement (Current and /or in the community):  No (Comment) (Not appropriate on this admission.)  Discharge Needs  Concerns to be addressed:  No discharge needs identified Readmission within the last 30 days:  No Current discharge risk:  None Barriers to Discharge:  No Barriers Identified   Rod MaeVaughn, Khalila Buechner S, LCSW 07/09/2015, 10:57 AM [phone]

## 2015-07-09 NOTE — Progress Notes (Signed)
Subjective: 1 Day Post-Op Procedure(s) (LRB): INTRAMEDULLARY (IM) NAIL INTERTROCHANTRIC (Right)  Patient reports pain as moderate.  Tolerating POs.  Denies BM, however admits to flatulence.  Denies fever, chills, N/V.  Objective:   VITALS:  Temp:  [97 F (36.1 C)-98.6 F (37 C)] 97.5 F (36.4 C) (03/24 0100) Pulse Rate:  [63-93] 93 (03/24 0100) Resp:  [15-23] 16 (03/24 0100) BP: (117-165)/(47-69) 125/53 mmHg (03/24 0100) SpO2:  [93 %-100 %] 100 % (03/24 0100)  General: Frail elderly patient in NAD. Psych:  Appropriate mood and affect. Neuro:  A&O x 3 however she was confused as to which leg was operated on this morning., Moving all extremities, sensation intact to light touch HEENT:  EOMs intact Chest:  Even non-labored respirations Skin:  Dressing C/D/I, no rashes or lesions Extremities: warm/dry, mild edema, no erythema, or echymosis.  No lymphadenopathy. Pulses: Popliteus 2+ MSK:  ROM: Full ankle ROM, KE to -5 degrees, MMT: patient can perform quad set with pain, (-) Homan's    LABS  Recent Labs  07/08/15 1031 07/08/15 1409  HGB 10.1* 9.8*  WBC 8.4 10.0  PLT 187 197    Recent Labs  07/08/15 1031 07/08/15 1409  NA 141  --   K 4.6  --   CL 108  --   CO2 23  --   BUN 33*  --   CREATININE 0.93 1.00  GLUCOSE 150*  --     Recent Labs  07/08/15 1031  INR 1.14     Assessment/Plan: 1 Day Post-Op Procedure(s) (LRB): INTRAMEDULLARY (IM) NAIL INTERTROCHANTRIC (Right)  Up with therapy  WBAT RLE with rolling walker Lovenox for DVT prophylaxis, to be D/C'd with 81 mg ASA BID. Plan for 2 week outpatient post-op visit with Dr. Victorino DikeHewitt.  Alfredo MartinezJustin Johnhenry Tippin, PA-C, [facility] Office:  [phone]

## 2015-07-09 NOTE — NC FL2 (Signed)
  [place] MEDICAID FL2 LEVEL OF CARE SCREENING TOOL     IDENTIFICATION  Patient Name: [name] Fields Birthdate: 01/23/1911 Sex: female Admission Date (Current Location): 07/08/2015  [facility] [place]   Facility and Address:  The . [facility], [address], [place], [place] [address]      Provider Number: [id number]  Attending Physician Name and Address:  Meredeth IdeGagan S Lama, MD  Relative Name and Phone Number:       Current Level of Care: Hospital Recommended Level of Care: Skilled Nursing Facility Prior Approval Number:    Date Approved/Denied:   PASRR Number: [id number]  Discharge Plan: SNF    Current Diagnoses: Patient Active Problem List   Diagnosis Date Noted  . Femur fracture, right (HCC) 07/08/2015  . Complete heart block (HCC) 10/22/2014  . Essential hypertension 05/07/2013  . Second degree Mobitz II AV block 04/19/2011  . AV block, Mobitz 2 04/17/2011    Orientation RESPIRATION BLADDER Height & Weight     Self  Normal Continent Weight:   Height:     BEHAVIORAL SYMPTOMS/MOOD NEUROLOGICAL BOWEL NUTRITION STATUS      Continent  (Currently NPO, please see discharge summary for any changes.)  AMBULATORY STATUS COMMUNICATION OF NEEDS Skin   Total Care Verbally Surgical wounds                       Personal Care Assistance Level of Assistance  Bathing, Feeding, Dressing Bathing Assistance: Maximum assistance Feeding assistance: Maximum assistance Dressing Assistance: Maximum assistance     Functional Limitations Info             SPECIAL CARE FACTORS FREQUENCY  PT (By licensed PT), OT (By licensed OT)                    Contractures      Additional Factors Info  Code Status, Allergies Code Status Info: DNR Allergies Info: Avelox, Micardis, Neurontin, Oxycodone Hcl, Pregabalin           Current Medications (07/09/2015):  This is the current hospital active medication list Current  Facility-Administered Medications  Medication Dose Route Frequency Provider Last Rate Last Dose  . 0.9 %  sodium chloride infusion   Intravenous Continuous Jacinta ShoeJustin Pike Ollis, [place] 75 mL/hr at 07/08/15 2200    . acetaminophen (TYLENOL) tablet 650 mg  650 mg Oral Q6H PRN Jacinta ShoeJustin Pike Ollis, PA-C   650 mg at 07/09/15 [id number]   Or  . acetaminophen (TYLENOL) suppository 650 mg  650 mg Rectal Q6H PRN Jacinta ShoeJustin Pike Ollis, PA-C      . enoxaparin (LOVENOX) injection 40 mg  40 mg Subcutaneous Q24H Justin Pike [facility] [employer]llis, PA-C      . ondansetron [facility](ZOFRAN) tablet 4 mg  4 mg Oral Q6H PRN Jacinta ShoeJustin Pike Ollis, PA-C       Or  . ondansetron [facility](ZOFRAN) injection 4 mg  4 mg Intravenous Q6H PRN Jacinta ShoeJustin Pike Ollis, PA-C      . traMADol Janean Sark(ULTRAM) tablet 50 mg  50 mg Oral Q8H PRN Jacinta ShoeJustin Pike Ollis, [place]C         Discharge Medications: Please see discharge summary for a list of discharge medications.  Relevant Imaging Results:  Relevant Lab Results:   Additional Information SSN: [id number]  Rod MaeVaughn, Thao Vanover S, LCSW

## 2015-07-10 LAB — CBC
HCT: 22.6 % — ABNORMAL LOW (ref 36.0–46.0)
Hemoglobin: 7.4 g/dL — ABNORMAL LOW (ref 12.0–15.0)
MCH: 30.7 pg (ref 26.0–34.0)
MCHC: 32.7 g/dL (ref 30.0–36.0)
MCV: 93.8 fL (ref 78.0–100.0)
PLATELETS: 142 10*3/uL — AB (ref 150–400)
RBC: 2.41 MIL/uL — ABNORMAL LOW (ref 3.87–5.11)
RDW: 13.3 % (ref 11.5–15.5)
WBC: 8.8 10*3/uL (ref 4.0–10.5)

## 2015-07-10 LAB — PREPARE RBC (CROSSMATCH)

## 2015-07-10 MED ORDER — FUROSEMIDE 10 MG/ML IJ SOLN
20.0000 mg | Freq: Once | INTRAMUSCULAR | Status: AC
  Administered 2015-07-10: 20 mg via INTRAVENOUS
  Filled 2015-07-10: qty 2

## 2015-07-10 MED ORDER — SODIUM CHLORIDE 0.9 % IV SOLN: Freq: Once | INTRAVENOUS | Status: DC

## 2015-07-10 NOTE — Progress Notes (Signed)
LCSW called patient son per weekend handoff request. Spoke with son: [name] ArrowJoseph Fields [phone]   Son wants information left on his home phone number: [phone] due to driving and inability to write anything. He also wants to speak with his other brother about facilities that have offered beds and follow up on Sunday.  Weekend SW will follow up with son or he will call on Sunday to confirm placement.  [name] EmoryHannah Keeon Zurn LCSW, MSW

## 2015-07-10 NOTE — Progress Notes (Signed)
Subjective: 2 Days Post-Op Procedure(s) (LRB): INTRAMEDULLARY (IM) NAIL INTERTROCHANTRIC (Right) Patient reports pain as mild to right hip.  No other complaints. Denies SOB, CP, of calf pain.  Objective: Vital signs in last 24 hours: Temp:  [98.6 F (37 C)-100.2 F (37.9 C)] 100.2 F (37.9 C) (03/25 0644) Pulse Rate:  [65-91] 91 (03/25 0644) Resp:  [17-18] 18 (03/25 0644) BP: (107-159)/(44-78) 159/78 mmHg (03/25 0644) SpO2:  [90 %-100 %] 90 % (03/25 0644)  Intake/Output from previous day: 03/24 0701 - 03/25 0700 In: 480 [P.O.:480] Out: 300 [Urine:300] Intake/Output this shift:     Recent Labs  07/08/15 1031 07/08/15 1409 07/10/15 0553  HGB 10.1* 9.8* 7.4*    Recent Labs  07/08/15 1409 07/10/15 0553  WBC 10.0 8.8  RBC 3.35* 2.41*  HCT 31.7* 22.6*  PLT 197 142*    Recent Labs  07/08/15 1031 07/08/15 1409  NA 141  --   K 4.6  --   CL 108  --   CO2 23  --   BUN 33*  --   CREATININE 0.93 1.00  GLUCOSE 150*  --   CALCIUM 9.7  --     Recent Labs  07/08/15 1031  INR 1.14    Well nourished. Alert and oriented x3. RRR, Lungs clear, BS x4. Abdomen soft and non tender. Right Calf soft and non tender. Right hip dressing C/D/I. No DVT signs. Compartment soft. No signs of infection.  Right LE grossly neurovascular intact.  Assessment/Plan: 2 Days Post-Op Procedure(s) (LRB): INTRAMEDULLARY (IM) NAIL INTERTROCHANTRIC (Right) Up with PT On Lovenox Advance diet as tolerated Planning d/c to SNF when ready  Nicola Quesnell L 07/10/2015, 9:00 AM

## 2015-07-10 NOTE — Progress Notes (Signed)
[facility] PROGRESS NOTE  Lelon FrohlichMargaret W Alkema ZHY:[id number] DOB: 02/15/1911 DOA: 07/08/2015 PCP: Ginette OttoSTONEKING,HAL THOMAS, MD  Assessment/Plan:  Right femur fracture Status post intramedullary intertrochanteric nail -Like a mechanical fall.  Anemia Today hemoglobin is 7.4, hemoglobin dropped from 9.8 2 days ago. Likely post operative anemia. Will transfuse 2 units PRBC and check CBC in a.m.  Complete heart block - Status post permanent pacemaker, currently her rhythm is paced is on telemetry monitor  History of hypertension and diabetes mellitus in the past - Both appear to be stable, her medication has been stopped by PCP for some time now.  DVT Prophylaxis Lovenox   Code Status:  Full code Family Communication: No family present at bedside Disposition Plan: Skilled nursing facility   Consultants:  Orthopedics  Procedures:  None  Antibiotics:  None  HPI/Subjective: 82 y.o. female, With past medical history of hypertension, diabetes mellitus(currently off his medication as blood pressure and diabetes has been controlled), history of complete heart block in the past status post PPM, vision presents with fall, and right hip pain, apparently she fell overnight trying to go to the bathroom, she denies any syncope, lives with her son who put her on bed, this a.m. patient with severe right hip pain, in ED workup significant for right femur fracture, blood work with no significant abnormalities, patient denies shortness of breath, palpitation or chest pain, reports she has been more forgetful over last few month, I was called to admit.  Patient denies any complexes morning. Status post surgery. Hemoglobin is 7.4 today.  Objective: Filed Vitals:   07/10/15 0644 07/10/15 1300  BP: 159/78 147/47  Pulse: 91 71  Temp: 100.2 F (37.9 C) 98.2 F (36.8 C)  Resp: 18 18    Intake/Output Summary (Last 24 hours) at 07/10/15 1451 Last data filed at 07/10/15 1418  Gross per 24  hour  Intake    480 ml  Output    400 ml  Net     80 ml   There were no vitals filed for this visit.  Exam:   General:  Appears in no acute distress  Cardiovascular: S1-S2 regular  Respiratory: Clear to auscultation bilaterally  Abdomen: Soft, nontender, no organomegaly  Musculoskeletal: No cyanosis/clubbing/edema of the lower extremities   Data Reviewed: Basic Metabolic Panel:  Recent Labs Lab 07/08/15 1031 07/08/15 1409  NA 141  --   K 4.6  --   CL 108  --   CO2 23  --   GLUCOSE 150*  --   BUN 33*  --   CREATININE 0.93 1.00  CALCIUM 9.7  --    CBC:  Recent Labs Lab 07/08/15 1031 07/08/15 1409 07/10/15 0553  WBC 8.4 10.0 8.8  NEUTROABS 7.2  --   --   HGB 10.1* 9.8* 7.4*  HCT 31.6* 31.7* 22.6*  MCV 94.9 94.6 93.8  PLT 187 197 142*   CBG:  Recent Labs Lab 07/08/15 1510 07/08/15 1743 07/08/15 2026  GLUCAP 152* 139* 116*    Recent Results (from the past 240 hour(s))  Surgical pcr screen     Status: None   Collection Time: 07/08/15  2:15 PM  Result Value Ref Range Status   MRSA, PCR NEGATIVE NEGATIVE Final   Staphylococcus aureus NEGATIVE NEGATIVE Final    Comment:        The Xpert SA Assay (FDA approved for NASAL specimens in patients over 80 years of age), is one component of a comprehensive surveillance program.  Test performance has  been validated by [facility] for patients greater than or equal to 69 year old. It is not intended to diagnose infection nor to guide or monitor treatment.      Studies: Dg C-arm 1-60 Min  07/08/2015  CLINICAL DATA:  ORIF of right femur. EXAM: DG C-ARM 61-120 MIN; RIGHT FEMUR 2 VIEWS COMPARISON:  07/08/2015 right femur radiographs FLUOROSCOPY TIME:  C-arm fluoroscopic images were obtained intraoperatively and submitted for post operative interpretation. Please see the performing provider's procedural report for the fluoroscopy time utilized. FINDINGS: Five intraoperative spot fluoroscopic images of the  right femur are provided. These demonstrate interval reduction of the proximal femoral diaphysis fracture, which is now in essentially anatomic alignment. An antegrade intramedullary nail has been placed across the fracture with proximal and distal screws in place. IMPRESSION: Intraoperative images during ORIF of right femur fracture. Electronically Signed   By: Sebastian Ache M.D.   On: 07/08/2015 20:33   Dg Femur, Min 2 Views Right  07/08/2015  CLINICAL DATA:  ORIF of right femur. EXAM: DG C-ARM 61-120 MIN; RIGHT FEMUR 2 VIEWS COMPARISON:  07/08/2015 right femur radiographs FLUOROSCOPY TIME:  C-arm fluoroscopic images were obtained intraoperatively and submitted for post operative interpretation. Please see the performing provider's procedural report for the fluoroscopy time utilized. FINDINGS: Five intraoperative spot fluoroscopic images of the right femur are provided. These demonstrate interval reduction of the proximal femoral diaphysis fracture, which is now in essentially anatomic alignment. An antegrade intramedullary nail has been placed across the fracture with proximal and distal screws in place. IMPRESSION: Intraoperative images during ORIF of right femur fracture. Electronically Signed   By: Sebastian Ache M.D.   On: 07/08/2015 20:33    Scheduled Meds: . sodium chloride   Intravenous Once  . enoxaparin (LOVENOX) injection  40 mg Subcutaneous Q24H  . furosemide  20 mg Intravenous Once   Continuous Infusions: . sodium chloride 10 mL/hr at 07/10/15 1610    Active Problems:   Essential hypertension   Complete heart block (HCC)   Femur fracture, right (HCC)    Time spent: 25 min    [facility]  [facility] Pager [phone]. If 7PM-7AM, please contact night-coverage at [email or web address], password [facility] 07/10/2015, 2:51 PM  LOS: 2 days

## 2015-07-11 DIAGNOSIS — D649 Anemia, unspecified: Secondary | ICD-10-CM

## 2015-07-11 DIAGNOSIS — S72309A Unspecified fracture of shaft of unspecified femur, initial encounter for closed fracture: Secondary | ICD-10-CM

## 2015-07-11 DIAGNOSIS — I442 Atrioventricular block, complete: Secondary | ICD-10-CM | POA: Insufficient documentation

## 2015-07-11 LAB — BASIC METABOLIC PANEL
Anion gap: 7 (ref 5–15)
BUN: 18 mg/dL (ref 6–20)
CALCIUM: 8.6 mg/dL — AB (ref 8.9–10.3)
CHLORIDE: 108 mmol/L (ref 101–111)
CO2: 24 mmol/L (ref 22–32)
CREATININE: 0.78 mg/dL (ref 0.44–1.00)
GFR calc non Af Amer: >60 mL/min (ref >60)
Glucose, Bld: 186 mg/dL — ABNORMAL HIGH (ref 65–99)
Potassium: 4.4 mmol/L (ref 3.5–5.1)
SODIUM: 139 mmol/L (ref 135–145)

## 2015-07-11 LAB — CBC
HEMATOCRIT: 32.5 % — AB (ref 36.0–46.0)
HEMOGLOBIN: 11 g/dL — AB (ref 12.0–15.0)
MCH: 29.6 pg (ref 26.0–34.0)
MCHC: 33.8 g/dL (ref 30.0–36.0)
MCV: 87.4 fL (ref 78.0–100.0)
Platelets: 146 10*3/uL — ABNORMAL LOW (ref 150–400)
RBC: 3.72 MIL/uL — ABNORMAL LOW (ref 3.87–5.11)
RDW: 16 % — AB (ref 11.5–15.5)
WBC: 11 10*3/uL — ABNORMAL HIGH (ref 4.0–10.5)

## 2015-07-11 LAB — TYPE AND SCREEN
ABO/RH(D): B POS
ANTIBODY SCREEN: NEGATIVE
UNIT DIVISION: 0
Unit division: 0

## 2015-07-11 NOTE — Clinical Social Work Note (Signed)
CSW left message with patient's son to discuss bed choice.  CSW awaiting a return call.  Vickii PennaGina Jaxtyn Linville, LCSW [phone]  5N1-9; 2S 15-16 and Hospital Psychiatric Service Line Licensed Clinical Social Worker

## 2015-07-11 NOTE — Progress Notes (Signed)
   Subjective: 3 Days Post-Op Procedure(s) (LRB): INTRAMEDULLARY (IM) NAIL INTERTROCHANTRIC (Right) Patient reports pain as mild.   Patient seen in rounds for Dr. Victorino DikeHewitt. Patient is well, and has had no acute complaints or problems. No SOB or chest pain. No issues overnight.    Objective: Vital signs in last 24 hours: Temp:  [98.2 F (36.8 C)-99.1 F (37.3 C)] 98.7 F (37.1 C) (03/25 2327) Pulse Rate:  [55-86] 86 (03/25 2327) Resp:  [16-20] 16 (03/26 0500) BP: (139-177)/(45-93) 161/93 mmHg (03/25 2327) SpO2:  [95 %-100 %] 95 % (03/25 2327)  Intake/Output from previous day:  Intake/Output Summary (Last 24 hours) at 07/11/15 0733 Last data filed at 07/10/15 1418  Gross per 24 hour  Intake    240 ml  Output    100 ml  Net    140 ml     Labs:  Recent Labs  07/08/15 1031 07/08/15 1409 07/10/15 0553 07/11/15 0612  HGB 10.1* 9.8* 7.4* 11.0*    Recent Labs  07/10/15 0553 07/11/15 0612  WBC 8.8 11.0*  RBC 2.41* 3.72*  HCT 22.6* 32.5*  PLT 142* 146*    Recent Labs  07/08/15 1031 07/08/15 1409 07/11/15 0612  NA 141  --  139  K 4.6  --  4.4  CL 108  --  108  CO2 23  --  24  BUN 33*  --  18  CREATININE 0.93 1.00 0.78  GLUCOSE 150*  --  186*  CALCIUM 9.7  --  8.6*    Recent Labs  07/08/15 1031  INR 1.14    EXAM General - Patient is Alert and Oriented Extremity - Neurologically intact Intact pulses distally Dorsiflexion/Plantar flexion intact Compartment soft Dressing/Incision - clean, dry, no drainage Motor Function - intact, moving foot and toes well on exam.   Past Medical History  Diagnosis Date  . Diabetes mellitus   . Hypertension   . Second degree Mobitz II AV block     s/p PPM  . Arthritis   . Gout attack 03/2011    "for the first time; right wrist"  . Presence of permanent cardiac pacemaker     Assessment/Plan: 3 Days Post-Op Procedure(s) (LRB): INTRAMEDULLARY (IM) NAIL INTERTROCHANTRIC (Right) Active Problems:   Essential  hypertension   Complete heart block (HCC)   Femur fracture, right (HCC)  Estimated body mass index is 19.83 kg/(m^2) as calculated from the following:   Height as of 10/22/14: 4\' 11"  (1.499 m).   Weight as of 05/07/13: 44.566 kg (98 lb 4 oz). Advance diet Up with therapy Discharge to SNF when medically stable  DVT Prophylaxis - Aspirin Weight-Bearing as tolerated  Patient doing well from ortho standpoint. DC SNF when medically stable.    Dimitri PedAmber Daryus Sowash, PA-C Orthopaedic Surgery 07/11/2015, 7:33 AM

## 2015-07-11 NOTE — Progress Notes (Signed)
[facility] PROGRESS NOTE  [name] Fields YNW:[id number] DOB: 1910-07-20 DOA: 07/08/2015 PCP: Ginette Otto, MD  Assessment/Plan:  Right femur fracture Status post intramedullary intertrochanteric nail -Like a mechanical fall.  Anemia Resolved after patient received 2 units PRBC. Today hemoglobin is 11.0.   Complete heart block - Status post permanent pacemaker, currently her rhythm is paced is on telemetry monitor  History of hypertension and diabetes mellitus in the past - Both appear to be stable, her medication has been stopped by PCP for some time now.  DVT Prophylaxis Lovenox   Code Status:  Full code Family Communication: No family present at bedside Disposition Plan: Skilled nursing facility   Consultants:  Orthopedics  Procedures:  None  Antibiotics:  None  HPI/Subjective: 82 y.o. female, With past medical history of hypertension, diabetes mellitus(currently off his medication as blood pressure and diabetes has been controlled), history of complete heart block in the past status post PPM, vision presents with fall, and right hip pain, apparently she fell overnight trying to go to the bathroom, she denies any syncope, lives with her son who put her on bed, this a.m. patient with severe right hip pain, in ED workup significant for right femur fracture, blood work with no significant abnormalities, patient denies shortness of breath, palpitation or chest pain, reports she has been more forgetful over last few month, I was called to admit.  Patient denies any complexes morning. Status post surgery. Vision was given 2 units PRBC. This morning hemoglobin is 11.0  Objective: Filed Vitals:   07/11/15 0500 07/11/15 1216  BP:  153/54  Pulse:  82  Temp:  98.4 F (36.9 C)  Resp: 16 18    Intake/Output Summary (Last 24 hours) at 07/11/15 1301 Last data filed at 07/10/15 1418  Gross per 24 hour  Intake    240 ml  Output    100 ml  Net    140 ml    There were no vitals filed for this visit.  Exam:   General:  Appears in no acute distress  Cardiovascular: S1-S2 regular  Respiratory: Clear to auscultation bilaterally  Abdomen: Soft, nontender, no organomegaly  Musculoskeletal: No cyanosis/clubbing/edema of the lower extremities   Data Reviewed: Basic Metabolic Panel:  Recent Labs Lab 07/08/15 1031 07/08/15 1409 07/11/15 0612  NA 141  --  139  K 4.6  --  4.4  CL 108  --  108  CO2 23  --  24  GLUCOSE 150*  --  186*  BUN 33*  --  18  CREATININE 0.93 1.00 0.78  CALCIUM 9.7  --  8.6*   CBC:  Recent Labs Lab 07/08/15 1031 07/08/15 1409 07/10/15 0553 07/11/15 0612  WBC 8.4 10.0 8.8 11.0*  NEUTROABS 7.2  --   --   --   HGB 10.1* 9.8* 7.4* 11.0*  HCT 31.6* 31.7* 22.6* 32.5*  MCV 94.9 94.6 93.8 87.4  PLT 187 197 142* 146*   CBG:  Recent Labs Lab 07/08/15 1510 07/08/15 1743 07/08/15 2026  GLUCAP 152* 139* 116*    Recent Results (from the past 240 hour(s))  Surgical pcr screen     Status: None   Collection Time: 07/08/15  2:15 PM  Result Value Ref Range Status   MRSA, PCR NEGATIVE NEGATIVE Final   Staphylococcus aureus NEGATIVE NEGATIVE Final    Comment:        The Xpert SA Assay (FDA approved for NASAL specimens in patients over 47 years of age), is one  component of a comprehensive surveillance program.  Test performance has been validated by [facility] for patients greater than or equal to 80 year old. It is not intended to diagnose infection nor to guide or monitor treatment.      Studies: No results found.  Scheduled Meds: . sodium chloride   Intravenous Once  . enoxaparin (LOVENOX) injection  40 mg Subcutaneous Q24H   Continuous Infusions: . sodium chloride 10 mL/hr at 07/10/15 [id number]    Active Problems:   Essential hypertension   Complete heart block (HCC)   Femur fracture, right (HCC)    Time spent: 25 min    [facility],GAGAN S  [facility] Pager [phone]. If  7PM-7AM, please contact night-coverage at [email or web address], password [email or web address] 07/11/2015, 1:01 PM  LOS: 3 days

## 2015-07-12 DIAGNOSIS — I442 Atrioventricular block, complete: Secondary | ICD-10-CM

## 2015-07-12 DIAGNOSIS — W19XXXS Unspecified fall, sequela: Secondary | ICD-10-CM

## 2015-07-12 DIAGNOSIS — D62 Acute posthemorrhagic anemia: Secondary | ICD-10-CM

## 2015-07-12 DIAGNOSIS — S7291XS Unspecified fracture of right femur, sequela: Secondary | ICD-10-CM

## 2015-07-12 NOTE — Care Management Important Message (Signed)
Important Message  Patient Details  Name: [name] Fields MRN: [id number] Date of Birth: 1910-11-02   Medicare Important Message Given:  Yes    Bernadette HoitShoffner, Brianna Esson Coleman 07/12/2015, 1:01 PM

## 2015-07-12 NOTE — Discharge Planning (Signed)
[facility] [name], Joselyn ArrowJoseph Brosh, updated regarding discharge. Patient to be transported via EMS (approximate time 1:30)  Blue Medicare auth received on 07/12/2015: [id number] Next review date: 07/14/2015 RUG level: RVC  Marcelline Deistmily Mahasin [place], [place] [phone] Orthopedics: [phone] Surgical: [phone]

## 2015-07-12 NOTE — Discharge Summary (Addendum)
Physician Discharge Summary  [name] Fields ZOX:[id number] DOB: 1910-09-14 DOA: 07/08/2015  PCP: Ginette Otto, MD  Admit date: 07/08/2015 Discharge date: 07/12/2015  Recommendations for Outpatient Follow-up:  1. Follow-up with primary care physician in 2 weeks after discharge to make sure symptoms are stable. Follow-up with orthopedic surgery per scheduled appointment. Patient will continue aspirin for DVT prophylaxis on discharge.  Discharge Diagnoses:  Active Problems:   Essential hypertension   Complete heart block (HCC)   Femur fracture, right (HCC)   Anemia   Atrioventricular block, complete (HCC)    Discharge Condition: stable   Diet recommendation: as tolerated   History of present illness:  Per brief narrative per [facility] 07/11/15 '82 y.o. female, With past medical history of hypertension, diabetes mellitus(currently off his medication as blood pressure and diabetes has been controlled), history of complete heart block in the past status post PPM, vision presents with fall, and right hip pain, apparently she fell trying to go to the bathroom, she woke up with severe right hip pain." Patient was found to have right femur proximal shaft fracture. She underwent intramedullary intertrochanteric nail by orthopedic surgery.  Hospital Course:   Principal problem: Fall  /Right femur fracture proximal shaft - Pt is status post intramedullary intertrochanteric nail - D/C to SNF per PT recommendations - F/U with orthopedic surgery in 2 weeks after discharge  - Aspirin for DVT prophylaxis   Normocytic Anemia / Acute postoperative blood loss anemia - Status post 2 units of PRBC transfusion during this hospital stay - Posterior fusion hemoglobin stable at 11  Complete heart block - Status post permanent pacemaker   DVT Prophylaxis - Aspirin on D/C - Lovenox subQ in hospital   Code Status: DNR/DNI Family Communication: No family present at  bedside   Consultants:  Orthopedic surgery   Procedures:  Intramedullary nail   Antibiotics:  None    Signed:  Manson Passey, MD  [facility] 07/12/2015, 10:42 AM  Pager #: [phone]  Time spent in minutes: more than 30 minutes    Discharge Exam: Filed Vitals:   07/11/15 2137 07/12/15 0432  BP: 132/58 135/61  Pulse: 82 71  Temp: 97.7 F (36.5 C) 97.7 F (36.5 C)  Resp: 16 16   Filed Vitals:   07/11/15 0500 07/11/15 1216 07/11/15 2137 07/12/15 0432  BP:  153/54 132/58 135/61  Pulse:  82 82 71  Temp:  98.4 F (36.9 C) 97.7 F (36.5 C) 97.7 F (36.5 C)  TempSrc:   Oral Oral  Resp: SpO2:  97% 93% 94%    General: Pt is alert,  not in acute distress Cardiovascular: Regular rate and rhythm, S1/S2 + Respiratory: Clear to auscultation bilaterally, no wheezing, no crackles, no rhonchi Abdominal: Soft, non tender, non distended, bowel sounds +, no guarding Extremities: no cyanosis, pulses palpable bilaterally DP and PT Neuro: Grossly nonfocal  Discharge Instructions  Discharge Instructions    Call MD for:  difficulty breathing, headache or visual disturbances    Complete by:  As directed      Call MD for:  persistant nausea and vomiting    Complete by:  As directed      Call MD for:  severe uncontrolled pain    Complete by:  As directed      Diet - low sodium heart healthy    Complete by:  As directed      Increase activity slowly    Complete by:  As directed  Weight bearing as tolerated    Complete by:  As directed             Medication List    TAKE these medications        acetaminophen 650 MG CR tablet  Commonly known as:  TYLENOL  Take 650 mg by mouth every 8 (eight) hours as needed for pain.     aspirin EC 81 MG tablet  Take 1 tablet (81 mg total) by mouth 2 (two) times daily.           Follow-up Information    Follow up with HEWITT, Jonny Ruiz, MD. Schedule an appointment as soon as possible for a visit in 2  weeks.   Specialty:  Orthopedic Surgery   Contact information:   [address] [place] [address] [phone]       Follow up with Ginette Otto, MD. Schedule an appointment as soon as possible for a visit in 2 weeks.   Specialty:  Internal Medicine   Why:  Follow up appt after recent hospitalization   Contact information:   [address] [place] [address] [phone]        The results of significant diagnostics from this hospitalization (including imaging, microbiology, ancillary and laboratory) are listed below for reference.    Significant Diagnostic Studies: Dg Chest 1 View  07/08/2015  CLINICAL DATA:  Fall this morning with right hip pain EXAM: CHEST 1 VIEW COMPARISON:  11/26/2013 FINDINGS: Cardiac shadow is stable. Pacing device is again seen. The lungs are well aerated bilaterally. No acute bony abnormality is noted. Healed left clavicular fracture is seen. IMPRESSION: No active disease. Electronically Signed   By: Alcide Clever M.D.   On: 07/08/2015 10:43   Dg C-arm 1-60 Min  07/08/2015  CLINICAL DATA:  ORIF of right femur. EXAM: DG C-ARM 61-120 MIN; RIGHT FEMUR 2 VIEWS COMPARISON:  07/08/2015 right femur radiographs FLUOROSCOPY TIME:  C-arm fluoroscopic images were obtained intraoperatively and submitted for post operative interpretation. Please see the performing provider's procedural report for the fluoroscopy time utilized. FINDINGS: Five intraoperative spot fluoroscopic images of the right femur are provided. These demonstrate interval reduction of the proximal femoral diaphysis fracture, which is now in essentially anatomic alignment. An antegrade intramedullary nail has been placed across the fracture with proximal and distal screws in place. IMPRESSION: Intraoperative images during ORIF of right femur fracture. Electronically Signed   By: Sebastian Ache M.D.   On: 07/08/2015 20:33   Dg Hip Unilat With Pelvis 2-3 Views  Right  07/08/2015  CLINICAL DATA:  Right hip pain, fall at 3 a.m. this morning EXAM: DG HIP (WITH OR WITHOUT PELVIS) 2-3V RIGHT COMPARISON:  None. FINDINGS: Three views of the right hip submitted. There is diffuse osteopenia. There is displaced angulated spiral fracture in proximal shaft of the right femur. There is soft tissue swelling proximal thigh region. No hip dislocation. IMPRESSION: Displaced angulated fracture proximal shaft of the right femur. Soft tissue swelling proximal thigh region. Electronically Signed   By: Natasha Mead M.D.   On: 07/08/2015 10:24   Dg Femur, Min 2 Views Right  07/08/2015  CLINICAL DATA:  ORIF of right femur. EXAM: DG C-ARM 61-120 MIN; RIGHT FEMUR 2 VIEWS COMPARISON:  07/08/2015 right femur radiographs FLUOROSCOPY TIME:  C-arm fluoroscopic images were obtained intraoperatively and submitted for post operative interpretation. Please see the performing provider's procedural report for the fluoroscopy time utilized. FINDINGS: Five intraoperative spot fluoroscopic images of the  right femur are provided. These demonstrate interval reduction of the proximal femoral diaphysis fracture, which is now in essentially anatomic alignment. An antegrade intramedullary nail has been placed across the fracture with proximal and distal screws in place. IMPRESSION: Intraoperative images during ORIF of right femur fracture. Electronically Signed   By: Sebastian AcheAllen  Grady M.D.   On: 07/08/2015 20:33   Dg Femur, Min 2 Views Right  07/08/2015  CLINICAL DATA:  Pain following fall EXAM: RIGHT FEMUR 2 VIEWS COMPARISON:  None. FINDINGS: Frontal and lateral views were obtained. There is an obliquely oriented fracture of the proximal femoral diaphysis with posterior displacement and medial angulation of the distal fracture fragment with respect proximal fragment. Bones are osteoporotic. No other fractures. No dislocation. There is osteoarthritic change in the right femur and knee regions. There is extensive  arterial vascular calcification in the right thigh region. IMPRESSION: Obliquely oriented fracture proximal right femoral diaphysis with displacement angulation of fracture fragments. Bones osteoporotic. No dislocation. Arthropathy noted in the right hip and knee joints. Arterial vascular calcification noted. Electronically Signed   By: Bretta BangWilliam  Woodruff III M.D.   On: 07/08/2015 10:40    Microbiology: Recent Results (from the past 240 hour(s))  Surgical pcr screen     Status: None   Collection Time: 07/08/15  2:15 PM  Result Value Ref Range Status   MRSA, PCR NEGATIVE NEGATIVE Final   Staphylococcus aureus NEGATIVE NEGATIVE Final    Comment:        The Xpert SA Assay (FDA approved for NASAL specimens in patients over 80 years of age), is one component of a comprehensive surveillance program.  Test performance has been validated by [facility] for patients greater than or equal to 80 year old. It is not intended to diagnose infection nor to guide or monitor treatment.      Labs: Basic Metabolic Panel:  Recent Labs Lab 07/08/15 1031 07/08/15 1409 07/11/15 0612  NA 141  --  139  K 4.6  --  4.4  CL 108  --  108  CO2 23  --  24  GLUCOSE 150*  --  186*  BUN 33*  --  18  CREATININE 0.93 1.00 0.78  CALCIUM 9.7  --  8.6*   Liver Function Tests: No results for input(s): AST, ALT, ALKPHOS, BILITOT, PROT, ALBUMIN in the last 168 hours. No results for input(s): LIPASE, AMYLASE in the last 168 hours. No results for input(s): AMMONIA in the last 168 hours. CBC:  Recent Labs Lab 07/08/15 1031 07/08/15 1409 07/10/15 0553 07/11/15 0612  WBC 8.4 10.0 8.8 11.0*  NEUTROABS 7.2  --   --   --   HGB 10.1* 9.8* 7.4* 11.0*  HCT 31.6* 31.7* 22.6* 32.5*  MCV 94.9 94.6 93.8 87.4  PLT 187 197 142* 146*   Cardiac Enzymes: No results for input(s): CKTOTAL, CKMB, CKMBINDEX, TROPONINI in the last 168 hours. BNP: BNP (last 3 results) No results for input(s): BNP in the last 8760  hours.  ProBNP (last 3 results) No results for input(s): PROBNP in the last 8760 hours.  CBG:  Recent Labs Lab 07/08/15 1510 07/08/15 1743 07/08/15 2026  GLUCAP 152* 139* 116*

## 2015-07-12 NOTE — Progress Notes (Signed)
Subjective: 4 Days Post-Op Procedure(s) (LRB): INTRAMEDULLARY (IM) NAIL INTERTROCHANTRIC (Right)  Patient reports pain as mild to moderate.  Patient a little discouraged this morning with her like of progress and mobility.  Tolerating POs well.  Admits to BM.  Denies fever, chills, N/V.  Objective:   VITALS:  Temp:  [97.7 F (36.5 C)-98.4 F (36.9 C)] 97.7 F (36.5 C) (03/27 0432) Pulse Rate:  [71-82] 71 (03/27 0432) Resp:  [16-18] 16 (03/27 0432) BP: (132-153)/(54-61) 135/61 mmHg (03/27 0432) SpO2:  [93 %-97 %] 94 % (03/27 0432)  General: WDWN patient in NAD. Psych:  Appropriate mood and affect. Neuro:  A&O x 3, Moving all extremities, sensation intact to light touch HEENT:  EOMs intact Chest:  Even non-labored respirations Skin:  Dressing C/D/I, no rashes or lesions Extremities: warm/dry, no edema, erythema, or echymosis.  No lymphadenopathy. Pulses: Popliteus 2+ MSK:  ROM: KE to 5 degrees, full Ankle ROM, MMT: patient can perform quad set, (-) Homan's    LABS  Recent Labs  07/10/15 0553 07/11/15 0612  HGB 7.4* 11.0*  WBC 8.8 11.0*  PLT 142* 146*    Recent Labs  07/11/15 0612  NA 139  K 4.4  CL 108  CO2 24  BUN 18  CREATININE 0.78  GLUCOSE 186*   No results for input(s): LABPT, INR in the last 72 hours.   Assessment/Plan: 4 Days Post-Op Procedure(s) (LRB): INTRAMEDULLARY (IM) NAIL INTERTROCHANTRIC (Right)  Up with therapy  WBAT with rolling walker RLE D/C to SNF when bed available Plan for 2 week outpatient post-op visit with Dr. Victorino DikeHewitt. To be D/C'd with ASA 81 mg BID for DVT prophylaxis, script on chart  Joana ReamerJustin Ollis, PA-C, ATC [facility] Office:  [phone]

## 2015-07-12 NOTE — Clinical Social Work Placement (Signed)
   CLINICAL SOCIAL WORK PLACEMENT  NOTE  Date:  07/12/2015  Patient Details  Name: [name] Fields MRN: [id number] Date of Birth: 11/09/1910  Clinical Social Work is seeking post-discharge placement for this patient at the Skilled  Nursing Facility level of care (*CSW will initial, date and re-position this form in  chart as items are completed):  Yes   Patient/family provided with [place] Clinical Social Work Department's list of facilities offering this level of care within the geographic area requested by the patient (or if unable, by the patient's family).  Yes   Patient/family informed of their freedom to choose among providers that offer the needed level of care, that participate in Medicare, Medicaid or managed care program needed by the patient, have an available bed and are willing to accept the patient.  Yes   Patient/family informed of [place]'s ownership interest in [facility] and [facility], as well as of the fact that they are under no obligation to receive care at these facilities.  PASRR submitted to EDS on       PASRR number received on       Existing PASRR number confirmed on 07/09/15     FL2 transmitted to all facilities in geographic area requested by pt/family on 07/09/15     FL2 transmitted to all facilities within larger geographic area on       Patient informed that his/her managed care company has contracts with or will negotiate with certain facilities, including the following:        Yes   Patient/family informed of bed offers received.  [facility] bed [facility]     Physician recommends and patient chooses bed at      [facility] to [facility] on 07/12/15.  Patient to be transferred to facility by [facility]AR     Patient family notified on 07/12/15 of transfer.  Name of family member notified:  Joselyn ArrowJoseph Badger     PHYSICIAN Please prepare prescriptions, Please sign FL2      Additional Comment:    _______________________________________________ Rod MaeVaughn, Lavaya Defreitas S, LCSW 07/12/2015, 12:35 PM

## 2015-07-12 NOTE — Progress Notes (Signed)
[facility]. All questions/concerns addressed

## 2015-07-12 NOTE — Progress Notes (Addendum)
Physical Therapy Treatment Patient Details Name: [name] Fields MRN: [id number] DOB: 06-Aug-1910 Today's Date: 07/12/2015    History of Present Illness Pt admitted after fall at home with Right femur s/p IM nail. PMHx: DM, HTN, gout, arthritis (dementia per son)    PT Comments    Pt was able to get to [facility] and then to recliner chair. She needed max assist to transfer using the RW (seemed better with RW than with attempts without, surprisingly).  Pt was telling son earlier that she could walk.  Seems unaware that she has had surgery despite verbalizing that her right thigh is sore.  Scheduled to d/c to SNF for rehab today.  PT to follow acutely until d/c confirmed.  Called son at end of session to update him on progress.  Follow Up Recommendations  SNF     Equipment Recommendations  None recommended by PT    Recommendations for Other Services   NA     Precautions / Restrictions Precautions Precautions: Fall Restrictions RLE Weight Bearing: Weight bearing as tolerated    Mobility  Bed Mobility Overal bed mobility: Needs Assistance Bed Mobility: Supine to Sit     Supine to sit: Max assist;HOB elevated     General bed mobility comments: Max assist to support trunk, progress legs EOB and scoot hips using bed pad.  Pt assisting with mobility a bit more than last session, but still needs quite a bit of help to move.  Warm up exercises preformed before getting EOB to help with stiffness and pain.   Transfers Overall transfer level: Needs assistance Equipment used: Rolling walker (2 wheeled) Transfers: Sit to/from [employer] Sit to Stand: Max assist Stand pivot transfers: Max assist       General transfer comment: Pt seemed to do better with bil upper extremity supported by RW during sit to stand and transfers.  I had pt hold to walker when going to standing to avoid the transition of hands.  Due to posterior lean, the transition could actually make her more  off balance at this point than be safe/helpful.  PT had to physically turn pt once standing to get to [facility] and then to chair both on pt's left side.    Ambulation/Gait             General Gait Details: unable at this time          Balance Overall balance assessment: Needs assistance Sitting-balance support: Feet supported;Bilateral upper extremity supported Sitting balance-Leahy Scale: Poor Sitting balance - Comments: min assist seated EOB to prevent posterior LOB.  Postural control: Posterior lean Standing balance support: Bilateral upper extremity supported Standing balance-Leahy Scale: Zero                      Cognition Arousal/Alertness: Awake/alert Behavior During Therapy: Flat affect Overall Cognitive Status: History of cognitive impairments - at baseline                      Exercises Total Joint Exercises Ankle Circles/Pumps: AAROM;Both;20 reps Heel Slides: AAROM;Both;10 reps Hip ABduction/ADduction: AAROM;Both;10 reps        Pertinent Vitals/Pain Pain Assessment: Faces Faces Pain Scale: Hurts whole lot Pain Location: right thigh with mobility/standing Pain Descriptors / Indicators: Grimacing;Guarding Pain Intervention(s): Limited activity within patient's tolerance;Monitored during session;Repositioned           PT Goals (current goals can now be found in the care plan section) Acute Rehab PT Goals  Patient Stated Goal: to go home Progress towards PT goals: Progressing toward goals    Frequency  Min 3X/week    PT Plan Current plan remains appropriate       End of Session Equipment Utilized During Treatment: Gait belt Activity Tolerance: Patient limited by pain Patient left: in chair;with call bell/phone within reach;with chair alarm set     Time: [id number] PT Time Calculation (min) (ACUTE ONLY): 20 min  Charges:  $Therapeutic Activity: 8-22 mins                      Chavis Tessler B. Abcde Oneil, PT, DPT [phone]    07/12/2015, 1:19 PM

## 2015-10-06 ENCOUNTER — Encounter: Payer: Self-pay | Admitting: Podiatry

## 2015-10-06 ENCOUNTER — Ambulatory Visit (INDEPENDENT_AMBULATORY_CARE_PROVIDER_SITE_OTHER): Payer: Medicare Other | Admitting: Podiatry

## 2015-10-06 DIAGNOSIS — M79676 Pain in unspecified toe(s): Secondary | ICD-10-CM

## 2015-10-06 DIAGNOSIS — B351 Tinea unguium: Secondary | ICD-10-CM

## 2015-10-06 NOTE — Progress Notes (Signed)
Subjective:     Patient ID: [name] FrohlichMargaret W Fields, female   DOB: 26-Jul-1910, [age] y.o.   MRN: [id number]  HPI patient presents with caregiver with thick incurvated nailbeds 1-5 both feet   Review of Systems     Objective:   Physical Exam Neurovascular status intact with thick yellow brittle nails 1-5 both feet that cannot be taken care of by patient or caregiver    Assessment:     Chronic mycotic nail infection 1-5 both feet    Plan:     Debride painful nailbeds 1-5 both feet with no iatrogenic bleeding noted

## 2015-10-06 NOTE — Progress Notes (Signed)
   Subjective:    Patient ID: [name] FrohlichMargaret W Fields, female    DOB: 08-Feb-1911, [age] y.o.   MRN: [id number]  HPI "Just clip her toenails again.  One of them came off after I made this appointment."    Review of Systems     Objective:   Physical Exam        Assessment & Plan:

## 2015-10-27 ENCOUNTER — Ambulatory Visit (INDEPENDENT_AMBULATORY_CARE_PROVIDER_SITE_OTHER): Payer: Medicare Other | Admitting: Internal Medicine

## 2015-10-27 ENCOUNTER — Encounter: Payer: Self-pay | Admitting: Internal Medicine

## 2015-10-27 VITALS — BP 142/50 | HR 62 | Ht 59.0 in

## 2015-10-27 DIAGNOSIS — I10 Essential (primary) hypertension: Secondary | ICD-10-CM

## 2015-10-27 DIAGNOSIS — I442 Atrioventricular block, complete: Secondary | ICD-10-CM

## 2015-10-27 LAB — CUP PACEART INCLINIC DEVICE CHECK
Brady Statistic AP VP Percent: 78 %
Brady Statistic AP VS Percent: 1 % — CL
Brady Statistic AS VP Percent: 21 %
Implantable Lead Implant Date: 20130102
Implantable Lead Location: 753859
Implantable Lead Location: 753860
Implantable Lead Model: 1948
Lead Channel Pacing Threshold Amplitude: 0.5 V
Lead Channel Pacing Threshold Amplitude: 0.5 V
Lead Channel Pacing Threshold Pulse Width: 0.4 ms
Lead Channel Pacing Threshold Pulse Width: 0.4 ms
Lead Channel Sensing Intrinsic Amplitude: 1.2 mV
MDC IDC LEAD IMPLANT DT: 20130102
MDC IDC MSMT BATTERY IMPEDANCE: 1000 Ohm — AB
MDC IDC MSMT BATTERY VOLTAGE: 2.78 V
MDC IDC MSMT LEADCHNL RA IMPEDANCE VALUE: 400 Ohm
MDC IDC MSMT LEADCHNL RV IMPEDANCE VALUE: 626 Ohm
MDC IDC SESS DTM: 20170712133214
MDC IDC SET LEADCHNL RA PACING AMPLITUDE: 2 V
MDC IDC SET LEADCHNL RV PACING PULSEWIDTH: 0.4 ms
MDC IDC SET LEADCHNL RV SENSING SENSITIVITY: 2 mV
MDC IDC STAT BRADY AS VS PERCENT: 1 % — AB
Pulse Gen Serial Number: 7278688

## 2015-10-27 NOTE — Patient Instructions (Signed)
Medication Instructions:  Your physician recommends that you continue on your current medications as directed. Please refer to the Current Medication list given to you today.   Labwork: None ordered   Testing/Procedures: None ordered   Follow-Up: Your physician wants you to follow-up in: 6 months in the device clinic and 12 months with Dr Allred You will receive a reminder letter in the mail two months in advance. If you don't receive a letter, please call our office to schedule the follow-up appointment.     Any Other Special Instructions Will Be Listed Below (If Applicable).     If you need a refill on your cardiac medications before your next appointment, please call your pharmacy.   

## 2015-10-27 NOTE — Progress Notes (Signed)
PCP:  Ginette Otto, MD  The patient presents today for routine electrophysiology followup.  Since her last visit, the patient reports doing very well.  She had a mechanical fall in March resulting in femoral fracture.  Her family reports that she is not very active since.  Today, she denies symptoms of palpitations, chest pain, shortness of breath,  lower extremity edema, dizziness, presyncope, or syncope.  The patient feels that she is tolerating medications without difficulties and is otherwise without complaint today.   Past Medical History  Diagnosis Date  . Diabetes mellitus   . Hypertension   . Second degree Mobitz II AV block     s/p PPM  . Arthritis   . Gout attack 03/2011    "for the first time; right wrist"  . Presence of permanent cardiac pacemaker    Past Surgical History  Procedure Laterality Date  . Cholecystectomy    . Pacemaker insertion  04/19/11    SJM Zephyr XL implanted by Dr Johney Frame for mobitz II second degree AV block  . Back surgery      "have had 3 diskectomies"  . Cataract extraction, bilateral    . Permanent pacemaker insertion Bilateral 04/19/2011    Procedure: PERMANENT PACEMAKER INSERTION;  Surgeon: Hillis Range, MD;  Location: [facility] CATH LAB;  Service: Cardiovascular;  Laterality: Bilateral;  . Intramedullary (im) nail intertrochanteric Right 07/08/2015    Procedure: INTRAMEDULLARY (IM) NAIL INTERTROCHANTRIC;  Surgeon: Toni Arthurs, MD;  Location: [facility] OR;  Service: Orthopedics;  Laterality: Right;  . Femur surgery Right 07/09/2015    Current Outpatient Prescriptions  Medication Sig Dispense Refill  . acetaminophen (TYLENOL) 650 MG CR tablet Take 650 mg by mouth every 8 (eight) hours as needed for pain.     No current facility-administered medications for this visit.    Allergies  Allergen Reactions  . Avelox [Moxifloxacin Hydrochloride]     Causes weakness  . Micardis [Telmisartan]     Causes hyperkalemia  . Neurontin [Gabapentin]     Causes  dizziness  . Oxycodone Hcl     Causes dizziness  . Pregabalin     Causes dizziness    Social History   Social History  . Marital Status: Widowed    Spouse Name: N/A  . Number of Children: N/A  . Years of Education: N/A   Occupational History  . Not on file.   Social History Main Topics  . Smoking status: Never Smoker   . Smokeless tobacco: Never Used  . Alcohol Use: No  . Drug Use: No  . Sexual Activity: No   Other Topics Concern  . Not on file   Social History Narrative    No family history on file.  ROS-  All systems are reviewed and are negative except as outlined in the HPI above   Physical Exam: Filed Vitals:   10/27/15 1132  BP: 142/50  Pulse: 62  Height:  (1.499 m)    GEN- The patient is elderly but well appearing, alert and oriented x 3 today.   Head- normocephalic, atraumatic Eyes-  Sclera clear, conjunctiva pink Ears- hearing intact Oropharynx- clear Lungs- Clear to ausculation bilaterally, normal work of breathing Chest- pacemaker pocket is well healed Heart- Regular rate and rhythm, no murmurs, rubs or gallops, PMI not laterally displaced GI- soft, NT, ND, + BS Extremities- no clubbing, cyanosis, or edema  Pacemaker interrogation- reviewed in detail today,  See PACEART report  Assessment and Plan:  Complete AV block   Normal  pacemaker function  See [facility] [employer] report  No changes today  HTN Stable No change required today   Return to the device clinic in 6months I will see in a year  Hillis RangeJames Chrishawna Farina MD, [facility] 10/27/2015 11:47 AM

## 2015-11-10 ENCOUNTER — Emergency Department (HOSPITAL_COMMUNITY)
Admission: EM | Admit: 2015-11-10 | Discharge: 2015-11-10 | Disposition: A | Discharge status: Home / Self Care | Payer: Medicare Other | Attending: Emergency Medicine | Admitting: Emergency Medicine

## 2015-11-10 ENCOUNTER — Encounter (HOSPITAL_COMMUNITY): Payer: Self-pay

## 2015-11-10 ENCOUNTER — Emergency Department (HOSPITAL_COMMUNITY): Payer: Medicare Other

## 2015-11-10 DIAGNOSIS — I10 Essential (primary) hypertension: Secondary | ICD-10-CM | POA: Insufficient documentation

## 2015-11-10 DIAGNOSIS — R531 Weakness: Secondary | ICD-10-CM | POA: Diagnosis present

## 2015-11-10 DIAGNOSIS — R4781 Slurred speech: Secondary | ICD-10-CM | POA: Diagnosis not present

## 2015-11-10 DIAGNOSIS — Z950 Presence of cardiac pacemaker: Secondary | ICD-10-CM | POA: Insufficient documentation

## 2015-11-10 DIAGNOSIS — E119 Type 2 diabetes mellitus without complications: Secondary | ICD-10-CM | POA: Diagnosis not present

## 2015-11-10 LAB — URINALYSIS, ROUTINE W REFLEX MICROSCOPIC
Bilirubin Urine: NEGATIVE
Glucose, UA: NEGATIVE mg/dL
HGB URINE DIPSTICK: NEGATIVE
Ketones, ur: NEGATIVE mg/dL
Leukocytes, UA: NEGATIVE
NITRITE: NEGATIVE
PROTEIN: NEGATIVE mg/dL
SPECIFIC GRAVITY, URINE: 1.016 (ref 1.005–1.030)
pH: 5 (ref 5.0–8.0)

## 2015-11-10 LAB — BASIC METABOLIC PANEL
ANION GAP: 3 — AB (ref 5–15)
BUN: 26 mg/dL — ABNORMAL HIGH (ref 6–20)
CALCIUM: 10.2 mg/dL (ref 8.9–10.3)
CO2: 29 mmol/L (ref 22–32)
CREATININE: 0.9 mg/dL (ref 0.44–1.00)
Chloride: 102 mmol/L (ref 101–111)
GFR, EST AFRICAN AMERICAN: 58 mL/min — AB (ref >60)
GFR, EST NON AFRICAN AMERICAN: 50 mL/min — AB (ref >60)
Glucose, Bld: 156 mg/dL — ABNORMAL HIGH (ref 65–99)
Potassium: 5.1 mmol/L (ref 3.5–5.1)
SODIUM: 134 mmol/L — AB (ref 135–145)

## 2015-11-10 LAB — CBG MONITORING, ED: Glucose-Capillary: 160 mg/dL — ABNORMAL HIGH (ref 65–99)

## 2015-11-10 LAB — CBC
HCT: 35.3 % — ABNORMAL LOW (ref 36.0–46.0)
HEMOGLOBIN: 11.1 g/dL — AB (ref 12.0–15.0)
MCH: 30.1 pg (ref 26.0–34.0)
MCHC: 31.4 g/dL (ref 30.0–36.0)
MCV: 95.7 fL (ref 78.0–100.0)
PLATELETS: 267 10*3/uL (ref 150–400)
RBC: 3.69 MIL/uL — AB (ref 3.87–5.11)
RDW: 12.4 % (ref 11.5–15.5)
WBC: 6.8 10*3/uL (ref 4.0–10.5)

## 2015-11-10 MED ORDER — SODIUM CHLORIDE 0.9 % IV BOLUS (SEPSIS)
500.0000 mL | Freq: Once | INTRAVENOUS | Status: AC
  Administered 2015-11-10: 500 mL via INTRAVENOUS

## 2015-11-10 NOTE — ED Triage Notes (Signed)
Patient here with family who report that she episode at home with increased sleeping and slurred speech. On arrival patient alert and denies pain. Speech clear and patient oriented to person and place, back to normal per family

## 2015-11-10 NOTE — ED Provider Notes (Signed)
[facility]-EMERGENCY DEPT Provider Note   CSN: [id number] Arrival date & time: 11/10/15  1351  First Provider Contact:  None       History   Chief Complaint Chief Complaint  Patient presents with  . Weakness    HPI JADAH BOBAK is a 82 y.o. female.   The history is provided by a relative.  Weakness  This is a new problem. The current episode started today. The problem has been resolved. Associated symptoms include weakness. Pertinent negatives include no abdominal pain, chest pain, fatigue, fever or numbness. Nothing aggravates the symptoms. She has tried nothing for the symptoms. The treatment provided no relief.    Past Medical History:  Diagnosis Date  . Arthritis   . Diabetes mellitus   . Gout attack 03/2011   "for the first time; right wrist"  . Hypertension   . Presence of permanent cardiac pacemaker   . Second degree Mobitz II AV block    s/p PPM    Patient Active Problem List   Diagnosis Date Noted  . Anemia 07/11/2015  . Atrioventricular block, complete (HCC)   . Femur fracture, right (HCC) 07/08/2015  . Complete heart block (HCC) 10/22/2014  . Essential hypertension 05/07/2013  . Second degree Mobitz II AV block 04/19/2011  . AV block, Mobitz 2 04/17/2011    Past Surgical History:  Procedure Laterality Date  . BACK SURGERY     "have had 3 diskectomies"  . CATARACT EXTRACTION, BILATERAL    . CHOLECYSTECTOMY    . FEMUR SURGERY Right 07/09/2015  . INTRAMEDULLARY (IM) NAIL INTERTROCHANTERIC Right 07/08/2015   Procedure: INTRAMEDULLARY (IM) NAIL INTERTROCHANTRIC;  Surgeon: Toni Arthurs, MD;  Location: [facility] OR;  Service: Orthopedics;  Laterality: Right;  . PACEMAKER INSERTION  04/19/11   SJM Zephyr XL implanted by Dr Johney Frame for mobitz II second degree AV block  . PERMANENT PACEMAKER INSERTION Bilateral 04/19/2011   Procedure: PERMANENT PACEMAKER INSERTION;  Surgeon: Hillis Range, MD;  Location: [facility] CATH LAB;  Service: Cardiovascular;  Laterality: Bilateral;     OB History    No data available       Home Medications    Prior to Admission medications   Medication Sig Start Date End Date Taking? Authorizing Provider  acetaminophen (TYLENOL) 650 MG CR tablet Take 650 mg by mouth every 8 (eight) hours as needed for pain.   Yes Historical Provider, MD    Family History No family history on file.  Social History Social History  Substance Use Topics  . Smoking status: Never Smoker  . Smokeless tobacco: Never Used  . Alcohol use No     Allergies   Avelox [moxifloxacin hydrochloride]; Micardis [telmisartan]; Neurontin [gabapentin]; Oxycodone hcl; and Pregabalin   Review of Systems Review of Systems  Constitutional: Negative for fatigue and fever.  Respiratory: Negative for shortness of breath.   Cardiovascular: Negative for chest pain and palpitations.  Gastrointestinal: Negative for abdominal pain.  Neurological: Positive for weakness. Negative for dizziness and numbness.    Physical Exam Updated Vital Signs BP 166/57   Pulse 64   Temp 98.2 F (36.8 C) (Oral)   Resp 19   SpO2 100%   Physical Exam  Constitutional: She appears well-developed and well-nourished. No distress.  HENT:  Head: Normocephalic and atraumatic.  Eyes: Conjunctivae are normal.  Neck: Neck supple.  Cardiovascular: Normal rate and regular rhythm.   No murmur heard. Pulmonary/Chest: Effort normal and breath sounds normal. No respiratory distress.  Abdominal: Soft. There is no tenderness.  Musculoskeletal: She exhibits no edema.  Neurological: She is alert. She has normal strength. No cranial nerve deficit or sensory deficit. She displays a negative Romberg sign. GCS eye subscore is 4. GCS verbal subscore is 5. GCS motor subscore is 6.  Skin: Skin is warm and dry.  Psychiatric: She has a normal mood and affect.  Nursing note and vitals reviewed.   ED Treatments / Results  Labs (all labs ordered are listed, but only abnormal results are  displayed) Labs Reviewed  BASIC METABOLIC PANEL - Abnormal; Notable for the following:       Result Value   Sodium 134 (*)    Glucose, Bld 156 (*)    BUN 26 (*)    GFR calc non Af Amer 50 (*)    GFR calc Af Amer 58 (*)    Anion gap 3 (*)    All other components within normal limits  CBC - Abnormal; Notable for the following:    RBC 3.69 (*)    Hemoglobin 11.1 (*)    HCT 35.3 (*)    All other components within normal limits  CBG MONITORING, ED - Abnormal; Notable for the following:    Glucose-Capillary 160 (*)    All other components within normal limits  URINALYSIS, ROUTINE W REFLEX MICROSCOPIC (NOT AT [facility])    EKG  EKG Interpretation  Date/Time:  Wednesday November 10 2015 14:36:36 EDT Ventricular Rate:  60 PR Interval:  228 QRS Duration: 182 QT Interval:  456 QTC Calculation: 456 R Axis:   -67 Text Interpretation:  Atrial-sensed ventricular-paced rhythm with prolonged AV conduction Abnormal ECG No significant change since last tracing Confirmed by FLOYD MD, Reuel Boom [phone]) on 11/10/2015 3:39:18 PM       Radiology Ct Head Wo Contrast  Result Date: 11/10/2015 CLINICAL DATA:  Altered mental status EXAM: CT HEAD WITHOUT CONTRAST TECHNIQUE: Contiguous axial images were obtained from the base of the skull through the vertex without intravenous contrast. COMPARISON:  02/17/2005 FINDINGS: No evidence of parenchymal hemorrhage or extra-axial fluid collection. No mass lesion, mass effect, or midline shift. No CT evidence of acute infarction. Subcortical white matter and periventricular small vessel ischemic changes. Intracranial atherosclerosis. Age related atrophy.  No ventriculomegaly. The visualized paranasal sinuses are essentially clear. The mastoid air cells are unopacified. No evidence of calvarial fracture. IMPRESSION: No evidence of acute intracranial abnormality. Atrophy with small vessel ischemic changes. Electronically Signed   By: Charline Bills M.D.   On: 11/10/2015  16:41  Dg Chest Portable 1 View  Result Date: 11/10/2015 CLINICAL DATA:  Slurred speech.  Low back pain. EXAM: PORTABLE CHEST 1 VIEW COMPARISON:  07/08/2015 FINDINGS: There is bilateral diffuse interstitial thickening. There is no focal parenchymal opacity. There is no pleural effusion or pneumothorax. There is mild stable cardiomegaly. Moderate osteoarthritis of the right glenohumeral joint. IMPRESSION: Mild cardiomegaly with mild pulmonary vascular congestion. Electronically Signed   By: Elige Ko   On: 11/10/2015 17:57   Procedures Procedures (including critical care time)  Medications Ordered in ED Medications  sodium chloride 0.9 % bolus 500 mL (0 mLs Intravenous Stopped 11/10/15 1740)     Initial Impression / Assessment and Plan / ED Course  I have reviewed the triage vital signs and the nursing notes.  Pertinent labs & imaging results that were available during my care of the patient were reviewed by me and considered in my medical decision making (see chart for details).  Clinical Course    Final Clinical Impressions(s) /  ED Diagnoses   Final diagnoses:  Weakness    New Prescriptions Discharge Medication List as of 11/10/2015  8:43 PM     The patient is a 82 year old female presented today for episode of weakness earlier in the day. Reports some mild slurring of speech with episodes of difficulty to arouse. No fevers chills nausea vomiting at home.  On arrival the patient is imminently stable and in no acute distress. BMP with elevated BUNs consistent with mild dehydration which is consistent with physical exam. CBC, UA, chest x-ray head CT unremarkable. Pacemaker interrogated with no abnormalities found. ECG with no acute ischemic changes. Low suspicion for cardiac etiology stroke, TIA, acute intra-abdominal pathology. Discussed the risks and benefits of observation in the hospital with the son who feels patient would be better served at home and with follow-up with his  PCP tomorrow.  Labs and ECG were viewed by myself  incorporated into medical decision making.  Discussed pertinent finding with patient or caregiver prior to discharge with no further questions.  Immediate return precautions given and understood.  Medical decision making supervised by my attending Dr. Adela Lank.   Tery Sanfilippo, MD PGY-3 Emergency Medicine    Tery Sanfilippo, MD 11/11/15 0002    Melene Plan, DO 11/11/15 1525

## 2015-11-10 NOTE — ED Notes (Signed)
Pt's caregiver verbalized understanding of discharge instructions and follow-up care. Denies further questions at this time.

## 2015-11-10 NOTE — ED Notes (Signed)
[employer] Rep at bedside with portable integrator.

## 2015-11-10 NOTE — ED Notes (Signed)
St jude tech states that nothing was found abnormal with pacemaker report given to MD and copy placed in medical records.

## 2015-11-10 NOTE — ED Notes (Signed)
Assessed patient skin. No skin tears noted on back or stomach.  Small S1 sacral pressure ulcer noted, patient notes minimal discomfort. R ankle appears swollen and a little warm to the touch and pt. States that it hurts

## 2015-11-10 NOTE — ED Notes (Signed)
Pt in & out cathed with assistance from [place], EMT, and [place], [place]

## 2015-11-10 NOTE — ED Notes (Signed)
After having 2 unsuccessful attempts at interrogating pts st jude's pace maker Rep was called and is in route. pts pacemaker is not compatible with our portable integrator.

## 2015-12-09 ENCOUNTER — Inpatient Hospital Stay (HOSPITAL_COMMUNITY)
Admission: EM | Admit: 2015-12-09 | Discharge: 2015-12-12 | DRG: 563 | Disposition: A | Discharge status: Skilled Nursing Facility | Payer: Medicare Other | Attending: Internal Medicine | Admitting: Internal Medicine

## 2015-12-09 ENCOUNTER — Encounter (HOSPITAL_COMMUNITY): Payer: Self-pay | Admitting: Emergency Medicine

## 2015-12-09 ENCOUNTER — Emergency Department (HOSPITAL_COMMUNITY): Payer: Medicare Other

## 2015-12-09 DIAGNOSIS — Y92008 Other place in unspecified non-institutional (private) residence as the place of occurrence of the external cause: Secondary | ICD-10-CM

## 2015-12-09 DIAGNOSIS — S42209A Unspecified fracture of upper end of unspecified humerus, initial encounter for closed fracture: Secondary | ICD-10-CM | POA: Diagnosis present

## 2015-12-09 DIAGNOSIS — Z888 Allergy status to other drugs, medicaments and biological substances status: Secondary | ICD-10-CM

## 2015-12-09 DIAGNOSIS — M25511 Pain in right shoulder: Secondary | ICD-10-CM | POA: Diagnosis not present

## 2015-12-09 DIAGNOSIS — E119 Type 2 diabetes mellitus without complications: Secondary | ICD-10-CM

## 2015-12-09 DIAGNOSIS — S42201A Unspecified fracture of upper end of right humerus, initial encounter for closed fracture: Secondary | ICD-10-CM | POA: Diagnosis not present

## 2015-12-09 DIAGNOSIS — W19XXXA Unspecified fall, initial encounter: Secondary | ICD-10-CM

## 2015-12-09 DIAGNOSIS — F0390 Unspecified dementia without behavioral disturbance: Secondary | ICD-10-CM | POA: Diagnosis present

## 2015-12-09 DIAGNOSIS — E875 Hyperkalemia: Secondary | ICD-10-CM

## 2015-12-09 DIAGNOSIS — Z950 Presence of cardiac pacemaker: Secondary | ICD-10-CM

## 2015-12-09 DIAGNOSIS — I441 Atrioventricular block, second degree: Secondary | ICD-10-CM | POA: Diagnosis present

## 2015-12-09 DIAGNOSIS — M1990 Unspecified osteoarthritis, unspecified site: Secondary | ICD-10-CM | POA: Diagnosis present

## 2015-12-09 DIAGNOSIS — I10 Essential (primary) hypertension: Secondary | ICD-10-CM | POA: Diagnosis present

## 2015-12-09 DIAGNOSIS — W010XXA Fall on same level from slipping, tripping and stumbling without subsequent striking against object, initial encounter: Secondary | ICD-10-CM | POA: Diagnosis present

## 2015-12-09 HISTORY — DX: Unspecified dementia, unspecified severity, without behavioral disturbance, psychotic disturbance, mood disturbance, and anxiety: F03.90

## 2015-12-09 LAB — BASIC METABOLIC PANEL
ANION GAP: 4 — AB (ref 5–15)
BUN: 35 mg/dL — ABNORMAL HIGH (ref 6–20)
CALCIUM: 9.2 mg/dL (ref 8.9–10.3)
CO2: 26 mmol/L (ref 22–32)
CREATININE: 0.74 mg/dL (ref 0.44–1.00)
Chloride: 106 mmol/L (ref 101–111)
GFR calc non Af Amer: >60 mL/min (ref >60)
Glucose, Bld: 160 mg/dL — ABNORMAL HIGH (ref 65–99)
Potassium: 5.7 mmol/L — ABNORMAL HIGH (ref 3.5–5.1)
SODIUM: 136 mmol/L (ref 135–145)

## 2015-12-09 LAB — CBC
HEMATOCRIT: 33.4 % — AB (ref 36.0–46.0)
HEMOGLOBIN: 11 g/dL — AB (ref 12.0–15.0)
MCH: 30.3 pg (ref 26.0–34.0)
MCHC: 32.9 g/dL (ref 30.0–36.0)
MCV: 92 fL (ref 78.0–100.0)
Platelets: 203 10*3/uL (ref 150–400)
RBC: 3.63 MIL/uL — ABNORMAL LOW (ref 3.87–5.11)
RDW: 13.1 % (ref 11.5–15.5)
WBC: 10.4 10*3/uL (ref 4.0–10.5)

## 2015-12-09 MED ORDER — ACETAMINOPHEN 325 MG PO TABS
650.0000 mg | ORAL_TABLET | Freq: Once | ORAL | Status: AC
  Administered 2015-12-09: 650 mg via ORAL
  Filled 2015-12-09: qty 2

## 2015-12-09 MED ORDER — FENTANYL CITRATE (PF) 100 MCG/2ML IJ SOLN
12.5000 ug | Freq: Once | INTRAMUSCULAR | Status: AC
  Administered 2015-12-09: 12.5 ug via INTRAVENOUS
  Filled 2015-12-09: qty 2

## 2015-12-09 NOTE — ED Triage Notes (Signed)
Patient BIB [facility] from home for unwitnessed fall. Patient has visible deformity to rt shoulder. Patient tried to ambulate while son was in basement, son got to pt w/in 30 seconds. Denies LOC, not on blood thinners or any other meds. . 20G IV placed in LFA. Patient given total of 100 mcg of fentanyl by EMS. Unable to move arm. Denies head, neck, back or hip pain. Arm placed in sling by EMS. EMS sates son is on his way.

## 2015-12-09 NOTE — ED Provider Notes (Signed)
[facility]-EMERGENCY DEPT Provider Note   CSN: [id number] Arrival date & time: 12/09/15  [id number]  By signing my name below, I, Alyssa GroveMartin Green, attest that this documentation has been prepared under the direction and in the presence of Audry Piliyler Cache Bills, PA-C. Electronically Signed: Alyssa GroveMartin Green, ED Scribe. 12/09/15. 8:27 PM.  History   Chief Complaint Chief Complaint  Patient presents with  . Fall   The history is provided by the patient and a relative. No language interpreter was used.    HPI Comments: [name] Fields is a [age] y.o. female with PMHx of Dementia, DM, HTN, who presents to the Emergency Department by EMS complaining of sudden onset, constant right shoulder pain s/p fall a few hours PTA. Pt was sitting on the side of the bed before the unwitnessed fall. Pt was found on the floor of the hall and the pt's son believes she was attempting to reach her wheel chair.  Son states pt is unaware that she is unable to walk. Pt's son denies LOC. Pt's right arm was placed in a sling by EMS. Pt does not remember her fall. Pt does not take any medications except Tylenol. Pt fell in 03/17 and broke her right femur. Pt denies head, neck, and back pain. No other symptoms noted.   Past Medical History:  Diagnosis Date  . Arthritis   . Diabetes mellitus   . Gout attack 03/2011   "for the first time; right wrist"  . Hypertension   . Presence of permanent cardiac pacemaker   . Second degree Mobitz II AV block    s/p PPM    Patient Active Problem List   Diagnosis Date Noted  . Anemia 07/11/2015  . Atrioventricular block, complete (HCC)   . Femur fracture, right (HCC) 07/08/2015  . Complete heart block (HCC) 10/22/2014  . Essential hypertension 05/07/2013  . Second degree Mobitz II AV block 04/19/2011  . AV block, Mobitz 2 04/17/2011    Past Surgical History:  Procedure Laterality Date  . BACK SURGERY     "have had 3 diskectomies"  . CATARACT EXTRACTION, BILATERAL    . CHOLECYSTECTOMY    . FEMUR  SURGERY Right 07/09/2015  . INTRAMEDULLARY (IM) NAIL INTERTROCHANTERIC Right 07/08/2015   Procedure: INTRAMEDULLARY (IM) NAIL INTERTROCHANTRIC;  Surgeon: Toni ArthursJohn Hewitt, MD;  Location: [facility] OR;  Service: Orthopedics;  Laterality: Right;  . PACEMAKER INSERTION  04/19/11   SJM Zephyr XL implanted by Dr Johney FrameAllred for mobitz II second degree AV block  . PERMANENT PACEMAKER INSERTION Bilateral 04/19/2011   Procedure: PERMANENT PACEMAKER INSERTION;  Surgeon: Hillis RangeJames Allred, MD;  [facility];  Service: Cardiovascular;  Laterality: Bilateral;    OB History    No data available     Home Medications    Prior to Admission medications   Medication Sig Start Date End Date Taking? Authorizing Provider  acetaminophen (TYLENOL) 650 MG CR tablet Take 650 mg by mouth every 8 (eight) hours as needed for pain.   Yes Historical Provider, MD    Family History History reviewed. No pertinent family history.  Social History Social History  Substance Use Topics  . Smoking status: Never Smoker  . Smokeless tobacco: Never Used  . Alcohol use No     Allergies   Avelox [moxifloxacin hydrochloride]; Micardis [telmisartan]; Neurontin [gabapentin]; Oxycodone hcl; and Pregabalin   Review of Systems Review of Systems 10 Systems reviewed and are negative for acute change except as noted in the HPI.  Physical Exam Updated Vital Signs SpO2  96% Comment: RA  Physical Exam  Constitutional: She is oriented to person, place, and time. Vital signs are normal. She appears well-developed and well-nourished.  HENT:  Head: Normocephalic and atraumatic. Not macrocephalic and not microcephalic. Head is without raccoon's eyes, without Battle's sign, without abrasion and without laceration.  Right Ear: Hearing normal.  Left Ear: Hearing normal.  Eyes: Conjunctivae and EOM are normal. Pupils are equal, round, and reactive to light.  Neck: Trachea normal, normal range of motion and full passive range of motion without pain.  Neck supple. No spinous process tenderness and no muscular tenderness present. No neck rigidity. No edema, no erythema and normal range of motion present.  Cardiovascular: Normal rate, regular rhythm, normal heart sounds and normal pulses.   Pulmonary/Chest: Effort normal and breath sounds normal. No respiratory distress.  Abdominal: She exhibits no distension.  Musculoskeletal:       Right shoulder: She exhibits decreased range of motion, swelling, deformity and pain.       Left shoulder: Normal.       Right elbow: Normal.      Left elbow: Normal.       Right hip: Normal.       Left hip: Normal.       Cervical back: Normal.       Thoracic back: Normal.       Lumbar back: Normal.  Right shoulder with obvious deformity. Ecchymosis around shoulder joint Distal pulses appreciated. Motor/sensation intact. Moving other extremities without difficulty. Neurovascularly intact x 4  Neurological: She is alert and oriented to person, place, and time. She has normal strength. No cranial nerve deficit or sensory deficit.  Cranial Nerves:  II: Pupils equal, round, reactive to light III,IV, VI: ptosis not present, extra-ocular motions intact bilaterally  V,VII: smile symmetric, facial light touch sensation equal VIII: hearing grossly normal bilaterally  IX,X: midline uvula rise  XI: bilateral shoulder shrug equal and strong XII: midline tongue extension  Skin: Skin is warm and dry.  Psychiatric: She has a normal mood and affect. Her speech is normal and behavior is normal. Thought content normal.  Nursing note and vitals reviewed.   ED Treatments / Results  DIAGNOSTIC STUDIES: Oxygen Saturation is 96% on RA, adequate by my interpretation.    COORDINATION OF CARE: 8:10 PM Discussed treatment plan with pt's son at bedside which includes CT Head Wo Contrast, CT Cervical Spine Wo Contrast, DG Chest 2 View DG Shoulder Right, CBC, BMP and Acetaminophen and son agreed to plan.  Labs (all labs ordered  are listed, but only abnormal results are displayed) Labs Reviewed  CBC - Abnormal; Notable for the following:       Result Value   RBC 3.63 (*)    Hemoglobin 11.0 (*)    HCT 33.4 (*)    All other components within normal limits  BASIC METABOLIC PANEL    EKG  EKG Interpretation None      Radiology Dg Chest 1 View  Result Date: 12/09/2015 CLINICAL DATA:  Larey SeatFell S morning.  Fractured proximal right humerus. EXAM: CHEST 1 VIEW COMPARISON:  11/10/2015 FINDINGS: Cardiac silhouette is mildly enlarged. There is a moderate size hiatal hernia. No mediastinal or hilar masses. Lungs are hyperexpanded. There is no evidence of pneumonia or pulmonary edema. No pleural effusion or convincing pneumothorax. There is a displaced fracture the proximal right humerus described under the right shoulder radiographs. There is an old fracture of the left clavicle. Left anterior chest wall sequential pacemaker is stable  in well positioned. IMPRESSION: 1. No acute cardiopulmonary disease. 2. Acute displaced fracture the proximal right humerus. Electronically Signed   By: Amie Portland M.D.   On: 12/09/2015 21:52   Dg Shoulder Right  Result Date: 12/09/2015 EXAM: RIGHT SHOULDER - 2+ VIEW COMPARISON:  None. FINDINGS: There is a displaced fracture of the proximal humerus across the metaphysis. The shaft fracture component has displaced anteriorly and medially by approximately 15 mm. The shaft component is also retracted superiorly relation to the femoral head neck component by approximately 2 cm. There multiple small comminuted fracture components adjacent to the major fracture components. The bones are extensively demineralized. The [facility] joint and glenohumeral joint are normally aligned. The humeral head has migrated superiorly narrowing of subacromial space consistent with a full-thickness chronic rotator cuff tear. Mild to moderate AC joint osteoarthritis is noted. There is surrounding soft tissue swelling. IMPRESSION: 1.  Displaced, comminuted fracture of the proximal right humerus as described. No dislocation. Electronically Signed   By: Amie Portland M.D.   On: 12/09/2015 21:51   Ct Head Wo Contrast  Result Date: 12/09/2015 CLINICAL DATA:  Unwitnessed fall, right shoulder deformity EXAM: CT HEAD WITHOUT CONTRAST CT CERVICAL SPINE WITHOUT CONTRAST TECHNIQUE: Multidetector CT imaging of the head and cervical spine was performed following the standard protocol without intravenous contrast. Multiplanar CT image reconstructions of the cervical spine were also generated. COMPARISON:  CT head dated 11/10/2015. C-spine radiographs dated 12/07/2010. FINDINGS: CT HEAD FINDINGS No evidence of parenchymal hemorrhage or extra-axial fluid collection. Stable benign calcified meningiomas beneath the right frontoparietal scalp and along the right anterior falx (series 3/image 27), unchanged since 2006. Additional calcification beneath the right parietal scalp may reflect a benign calcified meningioma versus a dural calcification (series 3/ image 24), unchanged since 200 all 6. No mass effect or midline shift. No CT evidence of acute infarction. Global cortical atrophy.  No ventriculomegaly. Subcortical white matter and periventricular small vessel ischemic changes. Intracranial atherosclerosis. The visualized paranasal sinuses are essentially clear. The mastoid air cells are unopacified. No evidence of calvarial fracture. CT CERVICAL SPINE FINDINGS Normal cervical lordosis. No evidence of fracture or dislocation. Vertebral body heights are maintained. Dens appears intact. No prevertebral soft tissue swelling. Moderate degenerative changes at C5-6. Visualized thyroid is unremarkable. Vascular calcifications. Visualized lung apices are notable for mild biapical pleural-parenchymal scarring. IMPRESSION: No evidence of acute intracranial abnormality. Atrophy with small vessel ischemic changes. No evidence of traumatic injury to the cervical spine.  Moderate degenerative changes at C5-6. Electronically Signed   By: Charline Bills M.D.   On: 12/09/2015 22:11   Ct Cervical Spine Wo Contrast  Result Date: 12/09/2015 CLINICAL DATA:  Unwitnessed fall, right shoulder deformity EXAM: CT HEAD WITHOUT CONTRAST CT CERVICAL SPINE WITHOUT CONTRAST TECHNIQUE: Multidetector CT imaging of the head and cervical spine was performed following the standard protocol without intravenous contrast. Multiplanar CT image reconstructions of the cervical spine were also generated. COMPARISON:  CT head dated 11/10/2015. C-spine radiographs dated 12/07/2010. FINDINGS: CT HEAD FINDINGS No evidence of parenchymal hemorrhage or extra-axial fluid collection. Stable benign calcified meningiomas beneath the right frontoparietal scalp and along the right anterior falx (series 3/image 27), unchanged since 2006. Additional calcification beneath the right parietal scalp may reflect a benign calcified meningioma versus a dural calcification (series 3/ image 24), unchanged since 200 all 6. No mass effect or midline shift. No CT evidence of acute infarction. Global cortical atrophy.  No ventriculomegaly. Subcortical white matter and periventricular small vessel ischemic changes.  Intracranial atherosclerosis. The visualized paranasal sinuses are essentially clear. The mastoid air cells are unopacified. No evidence of calvarial fracture. CT CERVICAL SPINE FINDINGS Normal cervical lordosis. No evidence of fracture or dislocation. Vertebral body heights are maintained. Dens appears intact. No prevertebral soft tissue swelling. Moderate degenerative changes at C5-6. Visualized thyroid is unremarkable. Vascular calcifications. Visualized lung apices are notable for mild biapical pleural-parenchymal scarring. IMPRESSION: No evidence of acute intracranial abnormality. Atrophy with small vessel ischemic changes. No evidence of traumatic injury to the cervical spine. Moderate degenerative changes at C5-6.  Electronically Signed   By: Charline Bills M.D.   On: 12/09/2015 22:11    Procedures Procedures (including critical care time)  Medications Ordered in ED Medications  acetaminophen (TYLENOL) tablet 650 mg (650 mg Oral Given 12/09/15 2117)  fentaNYL (SUBLIMAZE) injection 12.5 mcg (12.5 mcg Intravenous Given 12/09/15 2116)     Initial Impression / Assessment and Plan / ED Course  I have reviewed the triage vital signs and the nursing notes.  Pertinent labs & imaging results that were available during my care of the patient were reviewed by me and considered in my medical decision making (see chart for details).  Clinical Course  Value Comment By Time  Hemoglobin: (!) 11.0 (Reviewed) Audry Pili, PA-C 08/24 2116    Final Clinical Impressions(s) / ED Diagnoses  I have reviewed and evaluated the relevant laboratory values I have reviewed and evaluated the relevant imaging studies.  I have reviewed the relevant previous healthcare records. I have reviewed EMS Documentation. I obtained HPI from historian. Patient discussed with supervising physician  ED Course:  Assessment: Pt is a 89yF with hx Dementia, DM, HTN who presents with obvious right shoulder deformity s/p fall from standing. Unwitnessed. Unsure of LOC. On exam, pt cradling right arm. Nontoxic/nonseptic appearing. VSS. Afebrile. Lungs CTA. Heart RRR. Abdomen nontender soft. Extremities x 4 neurovascularly intact. Obvious right shoulder deformity with limited ROM. Labs unremarkable. CT Head unremarkable. CT Neck unremarkable. DG Shoulder with Displaced Communited Proximal Humerus Fracture. CXR unreamrkable. Given analgesia in ED.  Consult with Dr. Despina Hick ([facility]) will place in sling immobilizer and follow up as outpatient. Will admit to medicine for pain control.  Disposition/Plan:  Admit Pt acknowledges and agrees with plan  Supervising Physician Tilden Fossa, MD   Final diagnoses:  Proximal humerus fracture, right,  closed, initial encounter  Fall, initial encounter    New Prescriptions New Prescriptions   No medications on file     I personally performed the services described in this documentation, which was scribed in my presence. The recorded information has been reviewed and is accurate.     Audry Pili, PA-C 12/10/15 0522    Tilden Fossa, MD 12/10/15 [phone]

## 2015-12-09 NOTE — ED Notes (Signed)
Bed: [id number] Expected date:  Expected time:  Means of arrival:  Comments: EMS 82yo Fall / dislocated shoulder

## 2015-12-10 ENCOUNTER — Encounter (HOSPITAL_COMMUNITY): Payer: Self-pay | Admitting: Internal Medicine

## 2015-12-10 DIAGNOSIS — W19XXXA Unspecified fall, initial encounter: Secondary | ICD-10-CM

## 2015-12-10 DIAGNOSIS — I10 Essential (primary) hypertension: Secondary | ICD-10-CM

## 2015-12-10 DIAGNOSIS — E119 Type 2 diabetes mellitus without complications: Secondary | ICD-10-CM

## 2015-12-10 DIAGNOSIS — S42201A Unspecified fracture of upper end of right humerus, initial encounter for closed fracture: Principal | ICD-10-CM

## 2015-12-10 DIAGNOSIS — Y92008 Other place in unspecified non-institutional (private) residence as the place of occurrence of the external cause: Secondary | ICD-10-CM | POA: Diagnosis not present

## 2015-12-10 DIAGNOSIS — M1990 Unspecified osteoarthritis, unspecified site: Secondary | ICD-10-CM | POA: Diagnosis present

## 2015-12-10 DIAGNOSIS — M25511 Pain in right shoulder: Secondary | ICD-10-CM | POA: Diagnosis present

## 2015-12-10 DIAGNOSIS — Z950 Presence of cardiac pacemaker: Secondary | ICD-10-CM | POA: Diagnosis not present

## 2015-12-10 DIAGNOSIS — S42301A Unspecified fracture of shaft of humerus, right arm, initial encounter for closed fracture: Secondary | ICD-10-CM

## 2015-12-10 DIAGNOSIS — Z888 Allergy status to other drugs, medicaments and biological substances status: Secondary | ICD-10-CM | POA: Diagnosis not present

## 2015-12-10 DIAGNOSIS — W010XXA Fall on same level from slipping, tripping and stumbling without subsequent striking against object, initial encounter: Secondary | ICD-10-CM | POA: Diagnosis present

## 2015-12-10 DIAGNOSIS — E875 Hyperkalemia: Secondary | ICD-10-CM

## 2015-12-10 DIAGNOSIS — F0390 Unspecified dementia without behavioral disturbance: Secondary | ICD-10-CM | POA: Diagnosis present

## 2015-12-10 DIAGNOSIS — I441 Atrioventricular block, second degree: Secondary | ICD-10-CM | POA: Diagnosis present

## 2015-12-10 LAB — CBC
HCT: 28.5 % — ABNORMAL LOW (ref 36.0–46.0)
HEMOGLOBIN: 9.3 g/dL — AB (ref 12.0–15.0)
MCH: 30.6 pg (ref 26.0–34.0)
MCHC: 32.6 g/dL (ref 30.0–36.0)
MCV: 93.8 fL (ref 78.0–100.0)
Platelets: 222 10*3/uL (ref 150–400)
RBC: 3.04 MIL/uL — AB (ref 3.87–5.11)
RDW: 13.2 % (ref 11.5–15.5)
WBC: 8.8 10*3/uL (ref 4.0–10.5)

## 2015-12-10 LAB — GLUCOSE, CAPILLARY
GLUCOSE-CAPILLARY: 118 mg/dL — AB (ref 65–99)
Glucose-Capillary: 173 mg/dL — ABNORMAL HIGH (ref 65–99)
Glucose-Capillary: 130 mg/dL — ABNORMAL HIGH (ref 65–99)
Glucose-Capillary: 167 mg/dL — ABNORMAL HIGH (ref 65–99)

## 2015-12-10 LAB — COMPREHENSIVE METABOLIC PANEL
ALK PHOS: 43 U/L (ref 38–126)
ALT: 16 U/L (ref 14–54)
AST: 26 U/L (ref 15–41)
Albumin: 3.5 g/dL (ref 3.5–5.0)
Anion gap: 6 (ref 5–15)
BUN: 35 mg/dL — ABNORMAL HIGH (ref 6–20)
CALCIUM: 9.2 mg/dL (ref 8.9–10.3)
CO2: 25 mmol/L (ref 22–32)
CREATININE: 0.77 mg/dL (ref 0.44–1.00)
Chloride: 106 mmol/L (ref 101–111)
GFR calc non Af Amer: >60 mL/min (ref >60)
Glucose, Bld: 153 mg/dL — ABNORMAL HIGH (ref 65–99)
Potassium: 4.5 mmol/L (ref 3.5–5.1)
SODIUM: 137 mmol/L (ref 135–145)
Total Bilirubin: 0.5 mg/dL (ref 0.3–1.2)
Total Protein: 6.6 g/dL (ref 6.5–8.1)

## 2015-12-10 MED ORDER — ENOXAPARIN SODIUM 30 MG/0.3ML ~~LOC~~ SOLN
30.0000 mg | Freq: Every day | SUBCUTANEOUS | Status: DC
  Administered 2015-12-10 – 2015-12-12 (×3): 30 mg via SUBCUTANEOUS
  Filled 2015-12-10 (×3): qty 0.3

## 2015-12-10 MED ORDER — ENOXAPARIN SODIUM 40 MG/0.4ML ~~LOC~~ SOLN: 40.0000 mg | SUBCUTANEOUS | Status: DC

## 2015-12-10 MED ORDER — MORPHINE SULFATE (PF) 2 MG/ML IV SOLN: 0.5000 mg | Freq: Four times a day (QID) | INTRAVENOUS | Status: DC | PRN

## 2015-12-10 MED ORDER — ACETAMINOPHEN 325 MG PO TABS
650.0000 mg | ORAL_TABLET | Freq: Four times a day (QID) | ORAL | Status: DC | PRN
  Administered 2015-12-10: 650 mg via ORAL
  Filled 2015-12-10: qty 2

## 2015-12-10 MED ORDER — INSULIN ASPART 100 UNIT/ML ~~LOC~~ SOLN
0.0000 [IU] | Freq: Three times a day (TID) | SUBCUTANEOUS | Status: DC
  Administered 2015-12-10 (×2): 2 [IU] via SUBCUTANEOUS
  Administered 2015-12-11: 1 [IU] via SUBCUTANEOUS
  Administered 2015-12-11 – 2015-12-12 (×2): 2 [IU] via SUBCUTANEOUS

## 2015-12-10 MED ORDER — MORPHINE SULFATE (PF) 2 MG/ML IV SOLN
0.5000 mg | Freq: Four times a day (QID) | INTRAVENOUS | Status: DC | PRN
  Administered 2015-12-10: 0.5 mg via INTRAVENOUS
  Filled 2015-12-10: qty 1

## 2015-12-10 MED ORDER — SODIUM CHLORIDE 0.9 % IV SOLN
INTRAVENOUS | Status: AC
  Administered 2015-12-10 (×2): via INTRAVENOUS

## 2015-12-10 MED ORDER — TRAMADOL HCL 50 MG PO TABS
50.0000 mg | ORAL_TABLET | Freq: Two times a day (BID) | ORAL | Status: DC | PRN
  Administered 2015-12-11 – 2015-12-12 (×3): 50 mg via ORAL
  Filled 2015-12-10 (×3): qty 1

## 2015-12-10 MED ORDER — ACETAMINOPHEN 325 MG PO TABS
650.0000 mg | ORAL_TABLET | Freq: Four times a day (QID) | ORAL | Status: DC | PRN
  Administered 2015-12-10 – 2015-12-12 (×3): 650 mg via ORAL
  Filled 2015-12-10 (×3): qty 2

## 2015-12-10 MED ORDER — LORAZEPAM 2 MG/ML IJ SOLN
0.5000 mg | Freq: Four times a day (QID) | INTRAMUSCULAR | Status: DC | PRN
  Administered 2015-12-10 – 2015-12-11 (×2): 0.5 mg via INTRAVENOUS
  Filled 2015-12-10 (×2): qty 1

## 2015-12-10 MED ORDER — INSULIN ASPART 100 UNIT/ML ~~LOC~~ SOLN
0.0000 [IU] | Freq: Every day | SUBCUTANEOUS | Status: DC
Start: 2015-12-10 — End: 2015-12-12

## 2015-12-10 MED ORDER — TRAMADOL HCL 50 MG PO TABS
50.0000 mg | ORAL_TABLET | Freq: Four times a day (QID) | ORAL | Status: DC | PRN
  Administered 2015-12-10 (×2): 50 mg via ORAL
  Filled 2015-12-10 (×2): qty 1

## 2015-12-10 MED ORDER — HYDROMORPHONE HCL 1 MG/ML IJ SOLN
0.5000 mg | INTRAMUSCULAR | Status: DC | PRN
Start: 2015-12-10 — End: 2015-12-10

## 2015-12-10 MED ORDER — ACETAMINOPHEN 650 MG RE SUPP: 650.0000 mg | Freq: Four times a day (QID) | RECTAL | Status: DC | PRN

## 2015-12-10 NOTE — Clinical Social Work Placement (Signed)
   CLINICAL SOCIAL WORK PLACEMENT  NOTE  Date:  12/10/2015  Patient Details  Name: [name] Fields MRN: [id number] Date of Birth: 1910/05/20  Clinical Social Work is seeking post-discharge placement for this patient at the Skilled  Nursing Facility level of care (*CSW will initial, date and re-position this form in  chart as items are completed):      Patient/family provided with [facility] Clinical Social Work Department's list of facilities offering this level of care within the geographic area requested by the patient (or if unable, by the patient's family).  Yes   Patient/family informed of their freedom to choose among providers that offer the needed level of care, that participate in Medicare, Medicaid or managed care program needed by the patient, have an available bed and are willing to accept the patient.  Yes   Patient/family informed of [facility]'s ownership interest in [facility] and [facility], as well as of the fact that they are under no obligation to receive care at these facilities.  PASRR submitted to EDS on       PASRR number received on       Existing PASRR number confirmed on 12/10/15     FL2 transmitted to all facilities in geographic area requested by pt/family on 12/10/15     FL2 transmitted to all facilities within larger geographic area on       Patient informed that his/her managed care company has contracts with or will negotiate with certain facilities, including the following:            Patient/family informed of bed offers received.  Patient chooses bed at       Physician recommends and patient chooses bed at      Patient to be transferred to   on  .  Patient to be transferred to facility by       Patient family notified on   of transfer.  Name of family member notified:        PHYSICIAN       Additional Comment:    _______________________________________________ Vennie HomansHaidinger, Aron Needles Lee, LCSW  [phone] 12/10/2015, 12:31  PM

## 2015-12-10 NOTE — Clinical Social Work Note (Signed)
Clinical Social Work Assessment  Patient Details  Name: [name] Fields MRN: [id number] Date of Birth: 1910-09-01  Date of referral:  12/10/15               Reason for consult:  Discharge Planning, Facility Placement                Permission sought to share information with:  [facility] Contact Representative Permission granted to share information::  Yes, Verbal Permission Granted  Name::        Agency::     Relationship::     Contact Information:     Housing/Transportation Living arrangements for the past 2 months:  Single Family Home Source of Information:  Other (Comment Required) (Granddaughter) Patient Interpreter Needed:  None Criminal Activity/Legal Involvement Pertinent to Current Situation/Hospitalization:  No - Comment as needed Significant Relationships:  Adult Children Lives with:  Adult Children Do you feel safe going back to the place where you live?  No Need for family participation in patient care:  Yes (Comment)  Care giving concerns:  Pt's care cannot be managed at home following hospital d/c.   Social Worker assessment / plan:  Pt hospitalized on 12/09/15 with a right humeral fx. Pt was sleeping soundly this am when CSW attempted to visit. PN reviewed. PT eval is pending. Pt resides with her son, [name] Fields [phone]. Son contacted to assist with d/c planning. Son feels placement is needed. He is unable to manage pt's care at home. SNF search initiated and bed offers pending. CSW will begin [facility] process once PT recommendations are available.  Employment status:    Insurance informationAdvertising account executive:  Managed Medicare PT Recommendations:    Information / Referral to community resources:     Patient/Family's Response to care:  Disposition not yet determined.  Patient/Family's Understanding of and Emotional Response to Diagnosis, Current Treatment, and Prognosis:  Family is aware that pt has a right humeral fx. Pt's son, [name] Fields reports that he can't take  care of pt and would like pt to go to rehab. Son understands that PT recommendations are pending and [facility] is needed for insurance to cover cost of rehab.    Emotional Assessment Appearance:  Appears stated age Attitude/Demeanor/Rapport:  Unable to Assess Affect (typically observed):  Unable to Assess Orientation:  Oriented to Self Alcohol / Substance use:  Not Applicable Psych involvement (Current and /or in the community):  No (Comment)  Discharge Needs  Concerns to be addressed:    Readmission within the last 30 days:  No Current discharge risk:  None Barriers to Discharge:  No Barriers Identified   Vennie HomansHaidinger, Yuji Walth Lee, LCSW  [phone] 12/10/2015, 12:10 PM

## 2015-12-10 NOTE — Evaluation (Signed)
Physical Therapy Evaluation Patient Details Name: [name] Fields MRN: [id number] DOB: [birth year] Today's Date: 12/10/2015   History of Present Illness  Pt admitted post fall with R humeral fx (no surgery at this time) and hx of pacemaker, DM, dementia and R femur fx (3/17)  Clinical Impression  Pt admitted as above and presenting with functional mobility limitations 2* generalized weakness, balance deficits, elevated anxiety level with movement and pain.  Pt would greatly benefit from follow up rehab at SNF level to maximize IND and safety.    Follow Up Recommendations SNF    Equipment Recommendations  None recommended by PT    Recommendations for Other Services       Precautions / Restrictions Precautions Precautions: Fall Restrictions Weight Bearing Restrictions: Yes RUE Weight Bearing: Non weight bearing Other Position/Activity Restrictions: Pt with R UE in sling      Mobility  Bed Mobility Overal bed mobility: Needs Assistance;+2 for physical assistance;+ 2 for safety/equipment Bed Mobility: Supine to Sit;Sit to Supine     Supine to sit: Max assist;+2 for physical assistance;+2 for safety/equipment;HOB elevated     General bed mobility comments: Pt assisted to/from EOB with pad.   Transfers Overall transfer level: Needs assistance Equipment used: None Transfers: Sit to/from Stand           General transfer comment: Pt sitting at bedside with Sup balance x 5 min.  Attempted sit/stand and [facility]* pt increased pain and anxiety level.  Ambulation/Gait                Stairs            Wheelchair Mobility    Modified Rankin (Stroke Patients Only)       Balance Overall balance assessment: Needs assistance Sitting-balance support: Feet supported;Single extremity supported Sitting balance-Leahy Scale: Fair                                       Pertinent Vitals/Pain Pain Assessment: Faces Faces  Pain Scale: Hurts whole lot Pain Location: R shoulder with movement Pain Descriptors / Indicators: Grimacing;Guarding Pain Intervention(s): Limited activity within patient's tolerance;Monitored during session;Premedicated before session;Ice applied    Home Living Family/patient expects to be discharged to:: Skilled nursing facility Living Arrangements: Children               Additional Comments: son is present grossly 20hrs a day    Prior Function Level of Independence: Needs assistance   Gait / Transfers Assistance Needed: pt uses a RW and assist of 1 for home mobility  ADL's / Homemaking Assistance Needed: sponge bathes, family does homemaking  Comments: per family pt has Alzheimers and is most confused in AM, family states she has a poor appetite and drinks 2 boost drinks per day preferably chocolate     Hand Dominance        Extremity/Trunk Assessment   Upper Extremity Assessment: Generalized weakness;RUE deficits/detail RUE Deficits / Details: Humeral fx with UE in sling RUE: Unable to fully assess due to pain;Unable to fully assess due to immobilization       Lower Extremity Assessment: Generalized weakness      Cervical / Trunk Assessment: Kyphotic  Communication   Communication: HOH  Cognition Arousal/Alertness: Awake/alert Behavior During Therapy: Anxious Overall Cognitive Status: No family/caregiver present to determine baseline cognitive functioning  General Comments      Exercises        Assessment/Plan    PT Assessment Patient needs continued PT services  PT Diagnosis Difficulty walking   PT Problem List Decreased strength;Decreased range of motion;Decreased activity tolerance;Decreased balance;Decreased mobility;Decreased knowledge of use of DME;Decreased cognition;Decreased safety awareness;Pain  PT Treatment Interventions DME instruction;Functional mobility training;Therapeutic activities;Therapeutic  exercise;Balance training;Patient/family education   PT Goals (Current goals can be found in the Care Plan section) Acute Rehab PT Goals Patient Stated Goal: No goals expressed PT Goal Formulation: With patient Time For Goal Achievement: 12/17/15 Potential to Achieve Goals: Fair    Frequency Min 3X/week   Barriers to discharge        Co-evaluation               End of Session Equipment Utilized During Treatment: Gait belt;Other (comment) Activity Tolerance: Patient limited by pain (Sling R UE) Patient left: in bed;with call bell/phone within reach;with nursing/sitter in room Nurse Communication: Mobility status         Time: 1425-1445 PT Time Calculation (min) (ACUTE ONLY): 20 min   Charges:   PT Evaluation $PT Eval Low Complexity: 1 Procedure     PT G Codes:        Allon Costlow Jan 04, 2016, 2:52 PM

## 2015-12-10 NOTE — [place] FL2 (Signed)
[place] MEDICAID FL2 LEVEL OF CARE SCREENING TOOL     IDENTIFICATION  Patient Name: [name] Fields Birthdate: [birth year] Sex: female Admission Date (Current Location): 12/09/2015  [facility] [place]   Facility and Address:  [facility],  [address], [place] [id number]      Provider Number: [id number]  Attending Physician Name and Address:  Barnetta ChapelSylvester I Ogbata, MD  Relative Name and Phone Number:       Current Level of Care: Hospital Recommended Level of Care: Skilled Nursing Facility Prior Approval Number:    Date Approved/Denied:   PASRR Number: [id number]  Discharge Plan: SNF    Current Diagnoses: Patient Active Problem List   Diagnosis Date Noted  . Diabetes (HCC) 12/10/2015  . Hyperkalemia 12/10/2015  . Right humeral fracture 12/09/2015  . Anemia 07/11/2015  . Atrioventricular block, complete (HCC)   . Femur fracture, right (HCC) 07/08/2015  . Complete heart block (HCC) 10/22/2014  . Essential hypertension 05/07/2013  . Second degree Mobitz II AV block 04/19/2011  . AV block, Mobitz 2 04/17/2011    Orientation RESPIRATION BLADDER Height & Weight     Self  Normal Continent Weight:   Height:  5\' 2"  (157.5 cm)  BEHAVIORAL SYMPTOMS/MOOD NEUROLOGICAL BOWEL NUTRITION STATUS  Other (Comment) (no behaviors)   Continent Diet  AMBULATORY STATUS COMMUNICATION OF NEEDS Skin   Extensive Assist Verbally Normal                       Personal Care Assistance Level of Assistance  Bathing, Feeding, Dressing Bathing Assistance: Maximum assistance Feeding assistance: Limited assistance Dressing Assistance: Maximum assistance     Functional Limitations Info  Sight, Hearing, Speech Sight Info: Adequate Hearing Info: Adequate Speech Info: Adequate    SPECIAL CARE FACTORS FREQUENCY  PT (By licensed PT), OT (By licensed OT)     PT Frequency: 5 x wk OT Frequency: 5 x wk            Contractures Contractures Info:  Not present    Additional Factors Info  Code Status, Allergies Code Status Info: DNR             Current Medications (12/10/2015):  This is the current hospital active medication list Current Facility-Administered Medications  Medication Dose Route Frequency Provider Last Rate Last Dose  . 0.9 %  sodium chloride infusion   Intravenous Continuous Pearson GrippeJames Kim, MD 75 mL/hr at 12/10/15 0119    . acetaminophen (TYLENOL) tablet 650 mg  650 mg Oral Q6H PRN Pearson GrippeJames Kim, MD   650 mg at 12/10/15 [id number]   Or  . acetaminophen (TYLENOL) suppository 650 mg  650 mg Rectal Q6H PRN Pearson GrippeJames Kim, MD      . enoxaparin (LOVENOX) injection 30 mg  30 mg Subcutaneous Daily Pearson GrippeJames Kim, MD   30 mg at 12/10/15 [id number]  . HYDROmorphone (DILAUDID) injection 0.5 mg  0.5 mg Intravenous Q4H PRN Pearson GrippeJames Kim, MD      . insulin aspart (novoLOG) injection 0-5 Units  0-5 Units Subcutaneous QHS Pearson GrippeJames Kim, MD      . insulin aspart (novoLOG) injection 0-9 Units  0-9 Units Subcutaneous TID WC Pearson GrippeJames Kim, MD      . LORazepam (ATIVAN) injection 0.5 mg  0.5 mg Intravenous Q6H PRN Pearson GrippeJames Kim, MD   0.5 mg at 12/10/15 0119  . morphine 2 MG/ML injection 0.5 mg  0.5 mg Intravenous Q6H PRN Pearson GrippeJames Kim, MD      .  traMADol (ULTRAM) tablet 50 mg  50 mg Oral Q6H PRN Pearson Grippe, MD   50 mg at 12/10/15 1610     Discharge Medications: Please see discharge summary for a list of discharge medications.  Relevant Imaging Results:  Relevant Lab Results:   Additional Information SSN: [id number]  Saida Lonon, Dickey Gave, LCSW

## 2015-12-10 NOTE — H&P (Signed)
TRH H&P   Patient Demographics:    [name] Fields, is a 82 y.o. female  MRN: [id number]   DOB - Sep 01, 1910  Admit Date - 12/09/2015  Outpatient Primary MD for the patient is Ginette Otto, MD  Referring MD/NP/PA: Bruce Donath  Outpatient Specialists:    Patient coming from:   home  Chief Complaint  Patient presents with  . Fall      HPI:    [name] Fields  is a 82 y.o. female, w Dementia apparently fell unwitnessed Pt was in the hall on the floor.  Pt was found by her son.  Pt didn't pass out according to her son.  Pt was brought to ED due to complaints of right arm pain.   In ED,  Pt found to have R Humeral fracture.  Orthopedics was consulted by ED, and pt will be placed in slight and immobilized.  Pt will be admitted for pain control.  Family states unable to take care of patient at home requesting  Placement to rehab.    Review of systems:    In addition to the HPI above,  No Fever-chills, No Headache, No changes with Vision or hearing, No problems swallowing food or Liquids, No Chest pain, Cough or Shortness of Breath, No Abdominal pain, No Nausea or Vommitting, Bowel movements are regular, No Blood in stool or Urine, No dysuria, No new skin rashes or bruises,  No new weakness, tingling, numbness in any extremity, No recent weight gain or loss, No polyuria, polydypsia or polyphagia, No significant Mental Stressors.  A full 10 point Review of Systems was done, except as stated above, all other Review of Systems were negative.   With Past History of the following :    Past Medical History:  Diagnosis Date  . Arthritis   . Diabetes mellitus   . Gout attack 03/2011   "for the first time; right wrist"  . Hypertension   . Presence of permanent cardiac pacemaker   . Second degree Mobitz II AV block    s/p PPM      Past Surgical History:    Procedure Laterality Date  . BACK SURGERY     "have had 3 diskectomies"  . CATARACT EXTRACTION, BILATERAL    . CHOLECYSTECTOMY    . FEMUR SURGERY Right 07/09/2015  . INTRAMEDULLARY (IM) NAIL INTERTROCHANTERIC Right 07/08/2015   Procedure: INTRAMEDULLARY (IM) NAIL INTERTROCHANTRIC;  Surgeon: Toni Arthurs, MD;  Location: [facility] OR;  Service: Orthopedics;  Laterality: Right;  . PACEMAKER INSERTION  04/19/11   SJM Zephyr XL implanted by Dr Johney Frame for mobitz II second degree AV block  . PERMANENT PACEMAKER INSERTION Bilateral 04/19/2011   Procedure: PERMANENT PACEMAKER INSERTION;  Surgeon: Hillis Range, MD;  Location: [facility] CATH LAB;  Service: Cardiovascular;  Laterality: Bilateral;      Social History:     Social History  Substance Use Topics  .  Smoking status: Never Smoker  . Smokeless tobacco: Never Used  . Alcohol use No     Lives - at home with son  Mobility -   Doesn't ambulate by self.    Family History :     Family History  Problem Relation Age of Onset  . Family history unknown: Yes      Home Medications:   Prior to Admission medications   Medication Sig Start Date End Date Taking? Authorizing Provider  acetaminophen (TYLENOL) 650 MG CR tablet Take 650 mg by mouth every 8 (eight) hours as needed for pain.   Yes Historical Provider, MD     Allergies:     Allergies  Allergen Reactions  . Avelox [Moxifloxacin Hydrochloride]     Causes weakness  . Micardis [Telmisartan]     Causes hyperkalemia  . Neurontin [Gabapentin]     Causes dizziness  . Oxycodone Hcl     Causes dizziness  . Pregabalin     Causes dizziness     Physical Exam:   Vitals  SpO2 96 %.   1. General  lying in bed in NAD,    2. Normal affect and insight, Not Suicidal or Homicidal, Awake Alert, Oriented X 1  3. No F.N deficits, ALL C.Nerves Intact, Strength 5/5 all 4 extremities, Sensation intact all 4 extremities, Plantars down going.  4. Ears and Eyes appear Normal, Conjunctivae clear,  PERRLA. Moist Oral Mucosa.  5. Supple Neck, No JVD, No cervical lymphadenopathy appriciated, No Carotid Bruits.  6. Symmetrical Chest wall movement, Good air movement bilaterally, CTAB.  7. RRR, No Gallops, Rubs or Murmurs, No Parasternal Heave.  8. Positive Bowel Sounds, Abdomen Soft, No tenderness, No organomegaly appriciated,No rebound -guarding or rigidity.  9.  No Cyanosis, Normal Skin Turgor, No Skin Rash or Bruise.  10. Good muscle tone,  joints appear normal , no effusions, Normal ROM.  11. No Palpable Lymph Nodes in Neck or Axillae     Data Review:    CBC  Recent Labs Lab 12/09/15 2035  WBC 10.4  HGB 11.0*  HCT 33.4*  PLT 203  MCV 92.0  MCH 30.3  MCHC 32.9  RDW 13.1   ------------------------------------------------------------------------------------------------------------------  Chemistries   Recent Labs Lab 12/09/15 2322  NA 136  K 5.7*  CL 106  CO2 26  GLUCOSE 160*  BUN 35*  CREATININE 0.74  CALCIUM 9.2   ------------------------------------------------------------------------------------------------------------------ CrCl cannot be calculated (Unknown ideal weight.). ------------------------------------------------------------------------------------------------------------------ No results for input(s): TSH, T4TOTAL, T3FREE, THYROIDAB in the last 72 hours.  Invalid input(s): FREET3  Coagulation profile No results for input(s): INR, PROTIME in the last 168 hours. ------------------------------------------------------------------------------------------------------------------- No results for input(s): DDIMER in the last 72 hours. -------------------------------------------------------------------------------------------------------------------  Cardiac Enzymes No results for input(s): CKMB, TROPONINI, MYOGLOBIN in the last 168 hours.  Invalid input(s):  CK ------------------------------------------------------------------------------------------------------------------ No results found for: BNP   ---------------------------------------------------------------------------------------------------------------  Urinalysis    Component Value Date/Time   COLORURINE YELLOW 11/10/2015 2007   APPEARANCEUR CLEAR 11/10/2015 2007   LABSPEC 1.016 11/10/2015 2007   PHURINE 5.0 11/10/2015 2007   GLUCOSEU NEGATIVE 11/10/2015 2007   HGBUR NEGATIVE 11/10/2015 2007   BILIRUBINUR NEGATIVE 11/10/2015 2007   KETONESUR NEGATIVE 11/10/2015 2007   PROTEINUR NEGATIVE 11/10/2015 2007   UROBILINOGEN 0.2 11/22/2012 0652   NITRITE NEGATIVE 11/10/2015 2007   LEUKOCYTESUR NEGATIVE 11/10/2015 2007    ----------------------------------------------------------------------------------------------------------------   Imaging Results:    Dg Chest 1 View  Result Date: 12/09/2015 CLINICAL DATA:  Larey SeatFell S morning.  Fractured  proximal right humerus. EXAM: CHEST 1 VIEW COMPARISON:  11/10/2015 FINDINGS: Cardiac silhouette is mildly enlarged. There is a moderate size hiatal hernia. No mediastinal or hilar masses. Lungs are hyperexpanded. There is no evidence of pneumonia or pulmonary edema. No pleural effusion or convincing pneumothorax. There is a displaced fracture the proximal right humerus described under the right shoulder radiographs. There is an old fracture of the left clavicle. Left anterior chest wall sequential pacemaker is stable in well positioned. IMPRESSION: 1. No acute cardiopulmonary disease. 2. Acute displaced fracture the proximal right humerus. Electronically Signed   By: Amie Portland M.D.   On: 12/09/2015 21:52   Dg Shoulder Right  Result Date: 12/09/2015 EXAM: RIGHT SHOULDER - 2+ VIEW COMPARISON:  None. FINDINGS: There is a displaced fracture of the proximal humerus across the metaphysis. The shaft fracture component has displaced anteriorly and medially  by approximately 15 mm. The shaft component is also retracted superiorly relation to the femoral head neck component by approximately 2 cm. There multiple small comminuted fracture components adjacent to the major fracture components. The bones are extensively demineralized. The [facility] joint and glenohumeral joint are normally aligned. The humeral head has migrated superiorly narrowing of subacromial space consistent with a full-thickness chronic rotator cuff tear. Mild to moderate AC joint osteoarthritis is noted. There is surrounding soft tissue swelling. IMPRESSION: 1. Displaced, comminuted fracture of the proximal right humerus as described. No dislocation. Electronically Signed   By: Amie Portland M.D.   On: 12/09/2015 21:51   Ct Head Wo Contrast  Result Date: 12/09/2015 CLINICAL DATA:  Unwitnessed fall, right shoulder deformity EXAM: CT HEAD WITHOUT CONTRAST CT CERVICAL SPINE WITHOUT CONTRAST TECHNIQUE: Multidetector CT imaging of the head and cervical spine was performed following the standard protocol without intravenous contrast. Multiplanar CT image reconstructions of the cervical spine were also generated. COMPARISON:  CT head dated 11/10/2015. C-spine radiographs dated 12/07/2010. FINDINGS: CT HEAD FINDINGS No evidence of parenchymal hemorrhage or extra-axial fluid collection. Stable benign calcified meningiomas beneath the right frontoparietal scalp and along the right anterior falx (series 3/image 27), unchanged since 2006. Additional calcification beneath the right parietal scalp may reflect a benign calcified meningioma versus a dural calcification (series 3/ image 24), unchanged since 200 all 6. No mass effect or midline shift. No CT evidence of acute infarction. Global cortical atrophy.  No ventriculomegaly. Subcortical white matter and periventricular small vessel ischemic changes. Intracranial atherosclerosis. The visualized paranasal sinuses are essentially clear. The mastoid air cells are  unopacified. No evidence of calvarial fracture. CT CERVICAL SPINE FINDINGS Normal cervical lordosis. No evidence of fracture or dislocation. Vertebral body heights are maintained. Dens appears intact. No prevertebral soft tissue swelling. Moderate degenerative changes at C5-6. Visualized thyroid is unremarkable. Vascular calcifications. Visualized lung apices are notable for mild biapical pleural-parenchymal scarring. IMPRESSION: No evidence of acute intracranial abnormality. Atrophy with small vessel ischemic changes. No evidence of traumatic injury to the cervical spine. Moderate degenerative changes at C5-6. Electronically Signed   By: Charline Bills M.D.   On: 12/09/2015 22:11   Ct Cervical Spine Wo Contrast  Result Date: 12/09/2015 CLINICAL DATA:  Unwitnessed fall, right shoulder deformity EXAM: CT HEAD WITHOUT CONTRAST CT CERVICAL SPINE WITHOUT CONTRAST TECHNIQUE: Multidetector CT imaging of the head and cervical spine was performed following the standard protocol without intravenous contrast. Multiplanar CT image reconstructions of the cervical spine were also generated. COMPARISON:  CT head dated 11/10/2015. C-spine radiographs dated 12/07/2010. FINDINGS: CT HEAD FINDINGS No evidence of parenchymal hemorrhage  or extra-axial fluid collection. Stable benign calcified meningiomas beneath the right frontoparietal scalp and along the right anterior falx (series 3/image 27), unchanged since 2006. Additional calcification beneath the right parietal scalp may reflect a benign calcified meningioma versus a dural calcification (series 3/ image 24), unchanged since 200 all 6. No mass effect or midline shift. No CT evidence of acute infarction. Global cortical atrophy.  No ventriculomegaly. Subcortical white matter and periventricular small vessel ischemic changes. Intracranial atherosclerosis. The visualized paranasal sinuses are essentially clear. The mastoid air cells are unopacified. No evidence of calvarial  fracture. CT CERVICAL SPINE FINDINGS Normal cervical lordosis. No evidence of fracture or dislocation. Vertebral body heights are maintained. Dens appears intact. No prevertebral soft tissue swelling. Moderate degenerative changes at C5-6. Visualized thyroid is unremarkable. Vascular calcifications. Visualized lung apices are notable for mild biapical pleural-parenchymal scarring. IMPRESSION: No evidence of acute intracranial abnormality. Atrophy with small vessel ischemic changes. No evidence of traumatic injury to the cervical spine. Moderate degenerative changes at C5-6. Electronically Signed   By: Charline Bills M.D.   On: 12/09/2015 22:11      Assessment & Plan:    Active Problems:   Essential hypertension   Right humeral fracture   Diabetes (HCC)   Hyperkalemia    1. R humeral fracture Slight Appreciate orthopedic input.  Per ED, can follow up with orthopedics as outpatient.  Social work consult for rehab.   2. Dm2 fsbs ac and qhs, iss Check hga1c  3. Hypertension Pt is not presently on bp medication, we will observe and tx as needed  4. Hyperkalemia.  ?  Pt is not currently on medication that would cause hyperkalemia Will hydrate with ns iv Check cmp in am  5.  Dementia Stable.      DVT Prophylaxis Lovenox - SCDs   AM Labs Ordered, also please review Full Orders  Family Communication: Admission, patients condition and plan of care including tests being ordered have been discussed with the patient who indicate understanding and agree with the plan and Code Status.  Code Status  FULL CODE  Likely DC to  home  Condition GUARDED    Consults called: orthopedics by ED  Admission status:  Inpatient  Time spent in minutes : 45 minutes   Pearson Grippe M.D on 12/10/2015 at 12:19 AM  Between 7am to 7pm - Pager - [phone]. After 7pm go to [email or web address] - password [facility]  [facility] - Office  [phone]

## 2015-12-10 NOTE — Significant Event (Signed)
Called by floor RN: 1) urinary retention: apparently gets this frequently at home as well per son.  430 cc in bladder on scan.  Had them put in foley 2) pain control meds.  At RN request added tramadol back for moderate pain, and morphine for severe pain (0.5mg  morphine IV x1 seems to have worked).  Took off the 0.5mg  of dilaudid.

## 2015-12-10 NOTE — Progress Notes (Signed)
PROGRESS NOTE    [name] Fields  ZOX:[id number] DOB: [birth year] DOA: 12/09/2015 PCP: Ginette Otto, MD  Outpatient Specialists:   Brief Narrative: 82 y.o. female, w Dementia apparently fell unwitnessed Pt was in the hall on the floor.  Pt was found by her son.  Pt didn't pass out according to her son.  Pt was brought to ED due to complaints of right arm pain.   In ED,  Pt found to have R Humeral fracture.  Orthopedics was consulted by ED, and pt will be placed in slight and immobilized.  Pt will be admitted for pain control.  Family states unable to take care of patient at home requesting  Placement to rehab.   Assessment & Plan:   Active Problems:   Essential hypertension   Right humeral fracture   Diabetes (HCC)   Hyperkalemia   1. R humeral fracture - No Surgery as per Ortho. Supportive care (pain control and sling). Possible rehab.  2. Dm2 - Optimize.  3. Hypertension - Optimize. Ensure adequate pain control.  4. Hyperkalemia - Resolved.  5.  Dementia, stable. No behavioral problems.  DVT prophylaxis: [place] Lovenox Code Status: Full Family Communication:  Disposition Plan: Rehab if feasible  Consultants:   Case discussed with Ortho as documented above.  Procedures:   None  Antimicrobials:   None   Subjective: Nil new complaints. Pain seems reasonably controlled.  Objective: Vitals:   12/10/15 0100 12/10/15 0546 12/10/15 0730 12/10/15 1440  BP: (!) 152/42 (!) 125/46  (!) 135/42  Pulse: 65 72  60  Resp: 13 13  14   Temp: 98.5 F (36.9 C) 97 F (36.1 C)  97.6 F (36.4 C)  TempSrc: Oral Axillary  Axillary  SpO2: 98% 100%  99%  Height:   5\' 2"  (1.575 m)     Intake/Output Summary (Last 24 hours) at 12/10/15 1604 Last data filed at 12/10/15 1440  Gross per 24 hour  Intake          1071.25 ml  Output                0 ml  Net          1071.25 ml   There were no vitals filed for this visit.  Examination:  General exam: Appears calm and  comfortable  Respiratory system: Clear to auscultation.  Cardiovascular system: S1 & S2.. Gastrointestinal system: Abdomen is nondistended, soft and nontender.  Central nervous system: Awake and Alert and oriented Extremities: Rt humeral fracture noted. No leg edema.   Data Reviewed: I have personally reviewed following labs and imaging studies  CBC:  Recent Labs Lab 12/09/15 2035 12/10/15 0359  WBC 10.4 8.8  HGB 11.0* 9.3*  HCT 33.4* 28.5*  MCV 92.0 93.8  PLT 203 222   Basic Metabolic Panel:  Recent Labs Lab 12/09/15 2322 12/10/15 0359  NA 136 137  K 5.7* 4.5  CL 106 106  CO2 26 25  GLUCOSE 160* 153*  BUN 35* 35*  CREATININE 0.74 0.77  CALCIUM 9.2 9.2   GFR: CrCl cannot be calculated (Unknown ideal weight.). Liver Function Tests:  Recent Labs Lab 12/10/15 0359  AST 26  ALT 16  ALKPHOS 43  BILITOT 0.5  PROT 6.6  ALBUMIN 3.5   No results for input(s): LIPASE, AMYLASE in the last 168 hours. No results for input(s): AMMONIA in the last 168 hours. Coagulation Profile: No results for input(s): INR, PROTIME in the last 168 hours. Cardiac Enzymes: No results for  input(s): CKTOTAL, CKMB, CKMBINDEX, TROPONINI in the last 168 hours. BNP (last 3 results) No results for input(s): PROBNP in the last 8760 hours. HbA1C: No results for input(s): HGBA1C in the last 72 hours. CBG:  Recent Labs Lab 12/10/15 0818 12/10/15 1139  GLUCAP 118* 173*   Lipid Profile: No results for input(s): CHOL, HDL, LDLCALC, TRIG, CHOLHDL, LDLDIRECT in the last 72 hours. Thyroid Function Tests: No results for input(s): TSH, T4TOTAL, FREET4, T3FREE, THYROIDAB in the last 72 hours. Anemia Panel: No results for input(s): VITAMINB12, FOLATE, FERRITIN, TIBC, IRON, RETICCTPCT in the last 72 hours. Urine analysis:    Component Value Date/Time   COLORURINE YELLOW 11/10/2015 2007   APPEARANCEUR CLEAR 11/10/2015 2007   LABSPEC 1.016 11/10/2015 2007   PHURINE 5.0 11/10/2015 2007    GLUCOSEU NEGATIVE 11/10/2015 2007   HGBUR NEGATIVE 11/10/2015 2007   BILIRUBINUR NEGATIVE 11/10/2015 2007   KETONESUR NEGATIVE 11/10/2015 2007   PROTEINUR NEGATIVE 11/10/2015 2007   UROBILINOGEN 0.2 11/22/2012 0652   NITRITE NEGATIVE 11/10/2015 2007   LEUKOCYTESUR NEGATIVE 11/10/2015 2007   Sepsis Labs: @LABRCNTIP (procalcitonin:4,lacticidven:4)  )No results found for this or any previous visit (from the past 240 hour(s)).       Radiology Studies: Dg Chest 1 View  Result Date: 12/09/2015 CLINICAL DATA:  Larey SeatFell S morning.  Fractured proximal right humerus. EXAM: CHEST 1 VIEW COMPARISON:  11/10/2015 FINDINGS: Cardiac silhouette is mildly enlarged. There is a moderate size hiatal hernia. No mediastinal or hilar masses. Lungs are hyperexpanded. There is no evidence of pneumonia or pulmonary edema. No pleural effusion or convincing pneumothorax. There is a displaced fracture the proximal right humerus described under the right shoulder radiographs. There is an old fracture of the left clavicle. Left anterior chest wall sequential pacemaker is stable in well positioned. IMPRESSION: 1. No acute cardiopulmonary disease. 2. Acute displaced fracture the proximal right humerus. Electronically Signed   By: Amie Portlandavid  Ormond M.D.   On: 12/09/2015 21:52   Dg Shoulder Right  Result Date: 12/09/2015 EXAM: RIGHT SHOULDER - 2+ VIEW COMPARISON:  None. FINDINGS: There is a displaced fracture of the proximal humerus across the metaphysis. The shaft fracture component has displaced anteriorly and medially by approximately 15 mm. The shaft component is also retracted superiorly relation to the femoral head neck component by approximately 2 cm. There multiple small comminuted fracture components adjacent to the major fracture components. The bones are extensively demineralized. The [facility] joint and glenohumeral joint are normally aligned. The humeral head has migrated superiorly narrowing of subacromial space consistent with  a full-thickness chronic rotator cuff tear. Mild to moderate AC joint osteoarthritis is noted. There is surrounding soft tissue swelling. IMPRESSION: 1. Displaced, comminuted fracture of the proximal right humerus as described. No dislocation. Electronically Signed   By: Amie Portlandavid  Ormond M.D.   On: 12/09/2015 21:51   Ct Head Wo Contrast  Result Date: 12/09/2015 CLINICAL DATA:  Unwitnessed fall, right shoulder deformity EXAM: CT HEAD WITHOUT CONTRAST CT CERVICAL SPINE WITHOUT CONTRAST TECHNIQUE: Multidetector CT imaging of the head and cervical spine was performed following the standard protocol without intravenous contrast. Multiplanar CT image reconstructions of the cervical spine were also generated. COMPARISON:  CT head dated 11/10/2015. C-spine radiographs dated 12/07/2010. FINDINGS: CT HEAD FINDINGS No evidence of parenchymal hemorrhage or extra-axial fluid collection. Stable benign calcified meningiomas beneath the right frontoparietal scalp and along the right anterior falx (series 3/image 27), unchanged since 2006. Additional calcification beneath the right parietal scalp may reflect a benign calcified meningioma versus  a dural calcification (series 3/ image 24), unchanged since 200 all 6. No mass effect or midline shift. No CT evidence of acute infarction. Global cortical atrophy.  No ventriculomegaly. Subcortical white matter and periventricular small vessel ischemic changes. Intracranial atherosclerosis. The visualized paranasal sinuses are essentially clear. The mastoid air cells are unopacified. No evidence of calvarial fracture. CT CERVICAL SPINE FINDINGS Normal cervical lordosis. No evidence of fracture or dislocation. Vertebral body heights are maintained. Dens appears intact. No prevertebral soft tissue swelling. Moderate degenerative changes at C5-6. Visualized thyroid is unremarkable. Vascular calcifications. Visualized lung apices are notable for mild biapical pleural-parenchymal scarring.  IMPRESSION: No evidence of acute intracranial abnormality. Atrophy with small vessel ischemic changes. No evidence of traumatic injury to the cervical spine. Moderate degenerative changes at C5-6. Electronically Signed   By: Charline Bills M.D.   On: 12/09/2015 22:11   Ct Cervical Spine Wo Contrast  Result Date: 12/09/2015 CLINICAL DATA:  Unwitnessed fall, right shoulder deformity EXAM: CT HEAD WITHOUT CONTRAST CT CERVICAL SPINE WITHOUT CONTRAST TECHNIQUE: Multidetector CT imaging of the head and cervical spine was performed following the standard protocol without intravenous contrast. Multiplanar CT image reconstructions of the cervical spine were also generated. COMPARISON:  CT head dated 11/10/2015. C-spine radiographs dated 12/07/2010. FINDINGS: CT HEAD FINDINGS No evidence of parenchymal hemorrhage or extra-axial fluid collection. Stable benign calcified meningiomas beneath the right frontoparietal scalp and along the right anterior falx (series 3/image 27), unchanged since 2006. Additional calcification beneath the right parietal scalp may reflect a benign calcified meningioma versus a dural calcification (series 3/ image 24), unchanged since 200 all 6. No mass effect or midline shift. No CT evidence of acute infarction. Global cortical atrophy.  No ventriculomegaly. Subcortical white matter and periventricular small vessel ischemic changes. Intracranial atherosclerosis. The visualized paranasal sinuses are essentially clear. The mastoid air cells are unopacified. No evidence of calvarial fracture. CT CERVICAL SPINE FINDINGS Normal cervical lordosis. No evidence of fracture or dislocation. Vertebral body heights are maintained. Dens appears intact. No prevertebral soft tissue swelling. Moderate degenerative changes at C5-6. Visualized thyroid is unremarkable. Vascular calcifications. Visualized lung apices are notable for mild biapical pleural-parenchymal scarring. IMPRESSION: No evidence of acute  intracranial abnormality. Atrophy with small vessel ischemic changes. No evidence of traumatic injury to the cervical spine. Moderate degenerative changes at C5-6. Electronically Signed   By: Charline Bills M.D.   On: 12/09/2015 22:11        Scheduled Meds: . enoxaparin (LOVENOX) injection  30 mg Subcutaneous Daily  . insulin aspart  0-5 Units Subcutaneous QHS  . insulin aspart  0-9 Units Subcutaneous TID WC   Continuous Infusions:    LOS: 0 days    Time spent: Greater than 30 Minutes.    Berton Mount, MD  [facility] Pager #: [phone] 7PM-7AM contact night coverage as above

## 2015-12-10 NOTE — Progress Notes (Signed)
CSW assisting with d/c planning. [facility]. [employer] has provided authorization for placement this weekend if pt is stable for d/c. CSW will continue to follow to assist with d/c planning to SNF.  Cori RazorJamie Shamela Haydon LCSW [phone]

## 2015-12-10 NOTE — Plan of Care (Signed)
Problem: Education: Goal: Knowledge of [place] [facility] Education information/materials will improve Outcome: Not Met (add Reason) Dementia, spoke with son.

## 2015-12-11 LAB — HEMOGLOBIN A1C
Hgb A1c MFr Bld: 6.2 % — ABNORMAL HIGH (ref 4.8–5.6)
MEAN PLASMA GLUCOSE: 131 mg/dL

## 2015-12-11 LAB — GLUCOSE, CAPILLARY
GLUCOSE-CAPILLARY: 98 mg/dL (ref 65–99)
Glucose-Capillary: 154 mg/dL — ABNORMAL HIGH (ref 65–99)
Glucose-Capillary: 131 mg/dL — ABNORMAL HIGH (ref 65–99)
Glucose-Capillary: 138 mg/dL — ABNORMAL HIGH (ref 65–99)

## 2015-12-11 MED ORDER — TRAMADOL HCL 50 MG PO TABS: 50.0000 mg | ORAL_TABLET | Freq: Two times a day (BID) | ORAL | 0 refills | Status: AC | PRN

## 2015-12-11 NOTE — Progress Notes (Addendum)
CSW assisting with d/c planning. [facility] and reports that they are waiting on resident to d/c to open bed up for Mrs. Wagman. SNF will contact CSW once d/c takes place. CSW has updated MD / family / nsg. All d/c info has been sent to SNF.   Cori RazorJamie Caylor Tallarico LCSW [phone]  [phone]  CSW has left message for [facility] to see if SNF is ready to accept pt. Awaiting return call.  Cori RazorJamie Madison Albea LCSW [phone]  16 : 24   [facility]. NSG / Son updated.  Report left for tomorrow's weekend CSW to contact SNF Admissions Director  in am to facilitate d/c. Expect that [facility] did not [facility] as planned today. NSG / Son updated. NSG requested to update MD.  Cori RazorJamie Abrahm Mancia LCSW [phone]

## 2015-12-11 NOTE — Discharge Summary (Addendum)
Physician Discharge Summary  Lelon FrohlichMargaret W Brining XBM:[id number]:[id number] DOB: 07/05/1910 DOA: 12/09/2015  PCP: Ginette OttoSTONEKING,HAL THOMAS, MD  Admit date: 12/09/2015 Discharge date: 12/11/2015  Time spent: Greater than 30 minutes  Recommendations for Outpatient Follow-up:  [facility]. [facility] 2. Follow up with Orthopedic Surgeon, Dr. Ollen GrossFrank Aluisio in 2 weeks. 3. Follow up with PCP in 2 weeks 4. Cardiac diet 5. Activity as per rehab team 6. Ensure adequate pain control.    Discharge Diagnoses:  Active Problems:   Essential hypertension   Proximal humerus fracture   Diabetes (HCC)   Hyperkalemia   Fall  Discharge Condition: Stable  Diet recommendation: Cardiac  There were no vitals filed for this visit.  History of present illness: 82 year old female with history of dementia.  Patient had an unwitnessed fall. Patient was found by her son. Patient presented with right arm pain. X ray done revealed displaced comminuted fracture of proximal humerus. ED Physician discussed with the Orthopedic Surgeon on call, Dr. Ollen GrossFrank Aluisio and it was advised that right arm should placed in a sling and pain controlled. Out patient follow up with Orthopedic team was advised. Hospitalist team was consulted to admit patient for pain control.  Hospital Course: Patient was admitted for pain control. Patient's pain is controlled. Arm sling is continued. [facility].   Discussed discharge plan with patient's son, Gabriel RungJoe (Cell phone number [phone]). [name] prefers to contact the Orthopedic Surgeon on call, Dr. Shelle IronBeane prior to his mother being discharged. I have provided the [facility] Phone number to patient's son. [name] voiced that he understood the discharge plan.  Procedures:  None  Consultations:  ER Physician discussed with Dr. Ollen GrossFrank Aluisio, [facility]opedic Surgeon with [facility].  Discharge  Exam: Vitals:   12/11/15 0158 12/11/15 0542  BP: (!) 132/44 (!) 134/44  Pulse:  73  Resp:  16  Temp:  98.7 F (37.1 C)    General: Not in any distress Cardiovascular: S1S2 Respiratory: Clear to auscultation  Discharge Instructions   Discharge Instructions    Diet - low sodium heart healthy    Complete by:  As directed   Discharge instructions    Complete by:  As directed   [facility], Dr. Ollen GrossFrank Aluisio [phone] in 1-2 weeks. Follow up with PCP in 1-2 weeks   Increase activity slowly    Complete by:  As directed     Current Discharge Medication List    START taking these medications   Details  traMADol (ULTRAM) 50 MG tablet Take 1 tablet (50 mg total) by mouth every 12 (twelve) hours as needed for moderate pain. Qty: 30 tablet, Refills: 0      CONTINUE these medications which have NOT CHANGED   Details  acetaminophen (TYLENOL) 650 MG CR tablet Take 650 mg by mouth every 8 (eight) hours as needed for pain.       Allergies  Allergen Reactions  . Avelox [Moxifloxacin Hydrochloride]     Causes weakness  . Micardis [Telmisartan]     Causes hyperkalemia  . Neurontin [Gabapentin]     Causes dizziness  . Oxycodone Hcl     Causes dizziness  . Pregabalin     Causes dizziness   Follow-up Information    Loanne DrillingALUISIO,FRANK V, MD. Schedule an appointment as soon as possible for a visit in 2 week(s).   Specialty:  Orthopedic Surgery Why:  Please make  appointment for patient Contact information: [address] [place] [address] [phone]        Ginette Otto, MD Follow up in 2 week(s).   Specialty:  Internal Medicine Contact information: [address] [place] [address] [phone]            The results of significant diagnostics from this hospitalization (including imaging, microbiology, ancillary and laboratory) are listed below for reference.    Significant Diagnostic  Studies: Dg Chest 1 View  Result Date: 12/09/2015 CLINICAL DATA:  Larey Seat S morning.  Fractured proximal right humerus. EXAM: CHEST 1 VIEW COMPARISON:  11/10/2015 FINDINGS: Cardiac silhouette is mildly enlarged. There is a moderate size hiatal hernia. No mediastinal or hilar masses. Lungs are hyperexpanded. There is no evidence of pneumonia or pulmonary edema. No pleural effusion or convincing pneumothorax. There is a displaced fracture the proximal right humerus described under the right shoulder radiographs. There is an old fracture of the left clavicle. Left anterior chest wall sequential pacemaker is stable in well positioned. IMPRESSION: 1. No acute cardiopulmonary disease. 2. Acute displaced fracture the proximal right humerus. Electronically Signed   By: Amie Portland M.D.   On: 12/09/2015 21:52   Dg Shoulder Right  Result Date: 12/09/2015 EXAM: RIGHT SHOULDER - 2+ VIEW COMPARISON:  None. FINDINGS: There is a displaced fracture of the proximal humerus across the metaphysis. The shaft fracture component has displaced anteriorly and medially by approximately 15 mm. The shaft component is also retracted superiorly relation to the femoral head neck component by approximately 2 cm. There multiple small comminuted fracture components adjacent to the major fracture components. The bones are extensively demineralized. The [facility] joint and glenohumeral joint are normally aligned. The humeral head has migrated superiorly narrowing of subacromial space consistent with a full-thickness chronic rotator cuff tear. Mild to moderate AC joint osteoarthritis is noted. There is surrounding soft tissue swelling. IMPRESSION: 1. Displaced, comminuted fracture of the proximal right humerus as described. No dislocation. Electronically Signed   By: Amie Portland M.D.   On: 12/09/2015 21:51   Ct Head Wo Contrast  Result Date: 12/09/2015 CLINICAL DATA:  Unwitnessed fall, right shoulder deformity EXAM: CT HEAD WITHOUT CONTRAST CT  CERVICAL SPINE WITHOUT CONTRAST TECHNIQUE: Multidetector CT imaging of the head and cervical spine was performed following the standard protocol without intravenous contrast. Multiplanar CT image reconstructions of the cervical spine were also generated. COMPARISON:  CT head dated 11/10/2015. C-spine radiographs dated 12/07/2010. FINDINGS: CT HEAD FINDINGS No evidence of parenchymal hemorrhage or extra-axial fluid collection. Stable benign calcified meningiomas beneath the right frontoparietal scalp and along the right anterior falx (series 3/image 27), unchanged since 2006. Additional calcification beneath the right parietal scalp may reflect a benign calcified meningioma versus a dural calcification (series 3/ image 24), unchanged since 200 all 6. No mass effect or midline shift. No CT evidence of acute infarction. Global cortical atrophy.  No ventriculomegaly. Subcortical white matter and periventricular small vessel ischemic changes. Intracranial atherosclerosis. The visualized paranasal sinuses are essentially clear. The mastoid air cells are unopacified. No evidence of calvarial fracture. CT CERVICAL SPINE FINDINGS Normal cervical lordosis. No evidence of fracture or dislocation. Vertebral body heights are maintained. Dens appears intact. No prevertebral soft tissue swelling. Moderate degenerative changes at C5-6. Visualized thyroid is unremarkable. Vascular calcifications. Visualized lung apices are notable for mild biapical pleural-parenchymal scarring. IMPRESSION: No evidence of acute intracranial abnormality. Atrophy with small vessel ischemic changes. No evidence of traumatic injury to the  cervical spine. Moderate degenerative changes at C5-6. Electronically Signed   By: Charline Bills M.D.   On: 12/09/2015 22:11   Ct Cervical Spine Wo Contrast  Result Date: 12/09/2015 CLINICAL DATA:  Unwitnessed fall, right shoulder deformity EXAM: CT HEAD WITHOUT CONTRAST CT CERVICAL SPINE WITHOUT CONTRAST  TECHNIQUE: Multidetector CT imaging of the head and cervical spine was performed following the standard protocol without intravenous contrast. Multiplanar CT image reconstructions of the cervical spine were also generated. COMPARISON:  CT head dated 11/10/2015. C-spine radiographs dated 12/07/2010. FINDINGS: CT HEAD FINDINGS No evidence of parenchymal hemorrhage or extra-axial fluid collection. Stable benign calcified meningiomas beneath the right frontoparietal scalp and along the right anterior falx (series 3/image 27), unchanged since 2006. Additional calcification beneath the right parietal scalp may reflect a benign calcified meningioma versus a dural calcification (series 3/ image 24), unchanged since 200 all 6. No mass effect or midline shift. No CT evidence of acute infarction. Global cortical atrophy.  No ventriculomegaly. Subcortical white matter and periventricular small vessel ischemic changes. Intracranial atherosclerosis. The visualized paranasal sinuses are essentially clear. The mastoid air cells are unopacified. No evidence of calvarial fracture. CT CERVICAL SPINE FINDINGS Normal cervical lordosis. No evidence of fracture or dislocation. Vertebral body heights are maintained. Dens appears intact. No prevertebral soft tissue swelling. Moderate degenerative changes at C5-6. Visualized thyroid is unremarkable. Vascular calcifications. Visualized lung apices are notable for mild biapical pleural-parenchymal scarring. IMPRESSION: No evidence of acute intracranial abnormality. Atrophy with small vessel ischemic changes. No evidence of traumatic injury to the cervical spine. Moderate degenerative changes at C5-6. Electronically Signed   By: Charline Bills M.D.   On: 12/09/2015 22:11    Microbiology: No results found for this or any previous visit (from the past 240 hour(s)).   Labs: Basic Metabolic Panel:  Recent Labs Lab 12/09/15 2322 12/10/15 0359  NA 136 137  K 5.7* 4.5  CL 106 106   CO2 26 25  GLUCOSE 160* 153*  BUN 35* 35*  CREATININE 0.74 0.77  CALCIUM 9.2 9.2   Liver Function Tests:  Recent Labs Lab 12/10/15 0359  AST 26  ALT 16  ALKPHOS 43  BILITOT 0.5  PROT 6.6  ALBUMIN 3.5   No results for input(s): LIPASE, AMYLASE in the last 168 hours. No results for input(s): AMMONIA in the last 168 hours. CBC:  Recent Labs Lab 12/09/15 2035 12/10/15 0359  WBC 10.4 8.8  HGB 11.0* 9.3*  HCT 33.4* 28.5*  MCV 92.0 93.8  PLT 203 222   Cardiac Enzymes: No results for input(s): CKTOTAL, CKMB, CKMBINDEX, TROPONINI in the last 168 hours. BNP: BNP (last 3 results) No results for input(s): BNP in the last 8760 hours.  ProBNP (last 3 results) No results for input(s): PROBNP in the last 8760 hours.  CBG:  Recent Labs Lab 12/10/15 1139 12/10/15 1725 12/10/15 2041 12/11/15 0743 12/11/15 1148  GLUCAP 173* 130* 167* 98 154*       Signed:  Berton Mount, MD  [facility] Pager #: [phone] 7PM-7AM contact night coverage as above    Addendum: Patient could not be discharged today. Please change the discharge summary note to progress note for today.

## 2015-12-12 LAB — GLUCOSE, CAPILLARY
GLUCOSE-CAPILLARY: 108 mg/dL — AB (ref 65–99)
GLUCOSE-CAPILLARY: 200 mg/dL — AB (ref 65–99)
Glucose-Capillary: 91 mg/dL (ref 65–99)

## 2015-12-12 NOTE — Discharge Summary (Signed)
Physician Discharge Summary  [name] Fields:[id number] DOB: 11/30/1910 DOA: 12/09/2015  PCP: Ginette Otto, MD  Admit date: 12/09/2015 Discharge date: 12/12/2015  Time spent: Greater than 30 minutes  Recommendations for Outpatient Follow-up:  1. Discharge patient to [facility] for Rehab 2. Follow up with Orthopedic Surgeon, Dr. Ollen Gross in 2 weeks. 3. Follow up with PCP in 2 weeks 4. Cardiac diet 5. Activity as per rehab team 6. Ensure adequate pain control.    Discharge Diagnoses:  Active Problems:   Essential hypertension   Proximal humerus fracture   Diabetes (HCC)   Hyperkalemia   Fall  Discharge Condition: Stable  Diet recommendation: Cardiac  There were no vitals filed for this visit.  History of present illness: 82 year old female with history of dementia.  Patient had an unwitnessed fall. Patient was found by her son. Patient presented with right arm pain. X ray done revealed displaced comminuted fracture of proximal humerus. ED Physician discussed with the Orthopedic Surgeon on call, Dr. Ollen Gross and it was advised that right arm should placed in a sling and pain controlled. Out patient follow up with Orthopedic team was advised. Hospitalist team was consulted to admit patient for pain control.  Hospital Course: Patient was admitted for pain control. Patient's pain is controlled. Arm sling is continued. Patient will be discharged to [facility] for short term rehab.   Discussed discharge plan with patient's son, Gabriel Rung (Cell phone number [phone]). [name] prefers to contact the Orthopedic Surgeon on call, Dr. Shelle Iron prior to his mother being discharged. I have provided the [facility] Phone number to patient's son. [name] voiced that he understood the discharge plan.  Procedures:  None  Consultations:  ER Physician discussed with Dr. Ollen Gross, Orthopedic Surgeon with [facility].  Discharge  Exam: Vitals:   12/12/15 0644 12/12/15 1022  BP: (!) 158/45 (!) 147/45  Pulse: 65 72  Resp: 16 16  Temp: 98.8 F (37.1 C) 99 F (37.2 C)    General: Not in any distress Cardiovascular: S1S2 Respiratory: Clear to auscultation  Discharge Instructions   Discharge Instructions    Diet - low sodium heart healthy    Complete by:  As directed   Discharge instructions    Complete by:  As directed   Follow up with [facility], Dr. Homero Fellers Aluisio [phone]) in 1-2 weeks. Follow up with PCP in 1-2 weeks   Increase activity slowly    Complete by:  As directed     Current Discharge Medication List    START taking these medications   Details  traMADol (ULTRAM) 50 MG tablet Take 1 tablet (50 mg total) by mouth every 12 (twelve) hours as needed for moderate pain. Qty: 30 tablet, Refills: 0      CONTINUE these medications which have NOT CHANGED   Details  acetaminophen (TYLENOL) 650 MG CR tablet Take 650 mg by mouth every 8 (eight) hours as needed for pain.       Allergies  Allergen Reactions  . Avelox [Moxifloxacin Hydrochloride]     Causes weakness  . Micardis [Telmisartan]     Causes hyperkalemia  . Neurontin [Gabapentin]     Causes dizziness  . Oxycodone Hcl     Causes dizziness  . Pregabalin     Causes dizziness    Contact information for follow-up providers    Loanne Drilling, MD. Schedule an appointment as soon as possible for a visit in 2 week(s).   Specialty:  Orthopedic Surgery Why:  Please make appointment for patient Contact information: [address] [place] [address] [phone]        Ginette Otto, MD Follow up in 2 week(s).   Specialty:  Internal Medicine Contact information: [address] [place] [address] [phone]            Contact information for after-discharge care    Destination    [facility] .   Specialty:  Skilled Nursing Facility Contact  information: [address] [place] [address] [phone]                   The results of significant diagnostics from this hospitalization (including imaging, microbiology, ancillary and laboratory) are listed below for reference.    Significant Diagnostic Studies: Dg Chest 1 View  Result Date: 12/09/2015 CLINICAL DATA:  Larey Seat S morning.  Fractured proximal right humerus. EXAM: CHEST 1 VIEW COMPARISON:  11/10/2015 FINDINGS: Cardiac silhouette is mildly enlarged. There is a moderate size hiatal hernia. No mediastinal or hilar masses. Lungs are hyperexpanded. There is no evidence of pneumonia or pulmonary edema. No pleural effusion or convincing pneumothorax. There is a displaced fracture the proximal right humerus described under the right shoulder radiographs. There is an old fracture of the left clavicle. Left anterior chest wall sequential pacemaker is stable in well positioned. IMPRESSION: 1. No acute cardiopulmonary disease. 2. Acute displaced fracture the proximal right humerus. Electronically Signed   By: Amie Portland M.D.   On: 12/09/2015 21:52   Dg Shoulder Right  Result Date: 12/09/2015 EXAM: RIGHT SHOULDER - 2+ VIEW COMPARISON:  None. FINDINGS: There is a displaced fracture of the proximal humerus across the metaphysis. The shaft fracture component has displaced anteriorly and medially by approximately 15 mm. The shaft component is also retracted superiorly relation to the femoral head neck component by approximately 2 cm. There multiple small comminuted fracture components adjacent to the major fracture components. The bones are extensively demineralized. The [facility] joint and glenohumeral joint are normally aligned. The humeral head has migrated superiorly narrowing of subacromial space consistent with a full-thickness chronic rotator cuff tear. Mild to moderate AC joint osteoarthritis is noted. There is surrounding soft tissue swelling. IMPRESSION: 1. Displaced,  comminuted fracture of the proximal right humerus as described. No dislocation. Electronically Signed   By: Amie Portland M.D.   On: 12/09/2015 21:51   Ct Head Wo Contrast  Result Date: 12/09/2015 CLINICAL DATA:  Unwitnessed fall, right shoulder deformity EXAM: CT HEAD WITHOUT CONTRAST CT CERVICAL SPINE WITHOUT CONTRAST TECHNIQUE: Multidetector CT imaging of the head and cervical spine was performed following the standard protocol without intravenous contrast. Multiplanar CT image reconstructions of the cervical spine were also generated. COMPARISON:  CT head dated 11/10/2015. C-spine radiographs dated 12/07/2010. FINDINGS: CT HEAD FINDINGS No evidence of parenchymal hemorrhage or extra-axial fluid collection. Stable benign calcified meningiomas beneath the right frontoparietal scalp and along the right anterior falx (series 3/image 27), unchanged since 2006. Additional calcification beneath the right parietal scalp may reflect a benign calcified meningioma versus a dural calcification (series 3/ image 24), unchanged since 200 all 6. No mass effect or midline shift. No CT evidence of acute infarction. Global cortical atrophy.  No ventriculomegaly. Subcortical white matter and periventricular small vessel ischemic changes. Intracranial atherosclerosis. The visualized paranasal sinuses are essentially clear. The mastoid air cells are unopacified. No evidence of calvarial fracture. CT CERVICAL SPINE FINDINGS Normal cervical lordosis. No evidence  of fracture or dislocation. Vertebral body heights are maintained. Dens appears intact. No prevertebral soft tissue swelling. Moderate degenerative changes at C5-6. Visualized thyroid is unremarkable. Vascular calcifications. Visualized lung apices are notable for mild biapical pleural-parenchymal scarring. IMPRESSION: No evidence of acute intracranial abnormality. Atrophy with small vessel ischemic changes. No evidence of traumatic injury to the cervical spine. Moderate  degenerative changes at C5-6. Electronically Signed   By: Charline Bills M.D.   On: 12/09/2015 22:11   Ct Cervical Spine Wo Contrast  Result Date: 12/09/2015 CLINICAL DATA:  Unwitnessed fall, right shoulder deformity EXAM: CT HEAD WITHOUT CONTRAST CT CERVICAL SPINE WITHOUT CONTRAST TECHNIQUE: Multidetector CT imaging of the head and cervical spine was performed following the standard protocol without intravenous contrast. Multiplanar CT image reconstructions of the cervical spine were also generated. COMPARISON:  CT head dated 11/10/2015. C-spine radiographs dated 12/07/2010. FINDINGS: CT HEAD FINDINGS No evidence of parenchymal hemorrhage or extra-axial fluid collection. Stable benign calcified meningiomas beneath the right frontoparietal scalp and along the right anterior falx (series 3/image 27), unchanged since 2006. Additional calcification beneath the right parietal scalp may reflect a benign calcified meningioma versus a dural calcification (series 3/ image 24), unchanged since 200 all 6. No mass effect or midline shift. No CT evidence of acute infarction. Global cortical atrophy.  No ventriculomegaly. Subcortical white matter and periventricular small vessel ischemic changes. Intracranial atherosclerosis. The visualized paranasal sinuses are essentially clear. The mastoid air cells are unopacified. No evidence of calvarial fracture. CT CERVICAL SPINE FINDINGS Normal cervical lordosis. No evidence of fracture or dislocation. Vertebral body heights are maintained. Dens appears intact. No prevertebral soft tissue swelling. Moderate degenerative changes at C5-6. Visualized thyroid is unremarkable. Vascular calcifications. Visualized lung apices are notable for mild biapical pleural-parenchymal scarring. IMPRESSION: No evidence of acute intracranial abnormality. Atrophy with small vessel ischemic changes. No evidence of traumatic injury to the cervical spine. Moderate degenerative changes at C5-6.  Electronically Signed   By: Charline Bills M.D.   On: 12/09/2015 22:11    Microbiology: No results found for this or any previous visit (from the past 240 hour(s)).   Labs: Basic Metabolic Panel:  Recent Labs Lab 12/09/15 2322 12/10/15 0359  NA 136 137  K 5.7* 4.5  CL 106 106  CO2 26 25  GLUCOSE 160* 153*  BUN 35* 35*  CREATININE 0.74 0.77  CALCIUM 9.2 9.2   Liver Function Tests:  Recent Labs Lab 12/10/15 0359  AST 26  ALT 16  ALKPHOS 43  BILITOT 0.5  PROT 6.6  ALBUMIN 3.5   No results for input(s): LIPASE, AMYLASE in the last 168 hours. No results for input(s): AMMONIA in the last 168 hours. CBC:  Recent Labs Lab 12/09/15 2035 12/10/15 0359  WBC 10.4 8.8  HGB 11.0* 9.3*  HCT 33.4* 28.5*  MCV 92.0 93.8  PLT 203 222   Cardiac Enzymes: No results for input(s): CKTOTAL, CKMB, CKMBINDEX, TROPONINI in the last 168 hours. BNP: BNP (last 3 results) No results for input(s): BNP in the last 8760 hours.  ProBNP (last 3 results) No results for input(s): PROBNP in the last 8760 hours.  CBG:  Recent Labs Lab 12/11/15 1148 12/11/15 1711 12/11/15 2111 12/12/15 0810 12/12/15 1256  GLUCAP 154* 131* 138* 108* 200*       Signed:  Berton Mount, MD  [facility] Pager #: [phone] 7PM-7AM contact night coverage as above    Addendum - Patient has remained stable. Patient will be discharged today. Foley's Catheter  will be removed and if patient is able to void, foley's catheter will be discontinued.

## 2015-12-12 NOTE — Progress Notes (Signed)
[facility] dispatcher notified Report called to [place], LPN at [facility]. Informed nurse that patient is due to void on arrival.  No questions at this time.  Patient discharged to facility via EMS. Family Notified of transfer

## 2015-12-12 NOTE — Clinical Social Work Placement (Signed)
   CLINICAL SOCIAL WORK PLACEMENT  NOTE  Date:  12/12/2015  Patient Details  Name: [name] FrohlichMargaret W Marlette MRN: [id number] Date of Birth: Nov 20, 1910  Clinical Social Work is seeking post-discharge placement for this patient at the Skilled  Nursing Facility level of care (*CSW will initial, date and re-position this form in  chart as items are completed):      Patient/family provided with [facility] Clinical Social Work Department's list of facilities offering this level of care within the geographic area requested by the patient (or if unable, by the patient's family).  Yes   Patient/family informed of their freedom to choose among providers that offer the needed level of care, that participate in Medicare, Medicaid or managed care program needed by the patient, have an available bed and are willing to accept the patient.  Yes   Patient/family informed of [facility]'s ownership interest in [facility] and [facility], as well as of the fact that they are under no obligation to receive care at these facilities.  PASRR submitted to EDS on       PASRR number received on       Existing PASRR number confirmed on 12/10/15     FL2 transmitted to all facilities in geographic area requested by pt/family on 12/10/15     FL2 transmitted to all facilities within larger geographic area on       Patient informed that his/her managed care company has contracts with or will negotiate with certain facilities, including the following:        Yes   Patient/family informed of bed offers received.  [facility]     Physician recommends and patient chooses bed at      [facility] 12[facility].  Patient to be transferred to facility by [facility]     Patient family notified on 12/12/15 of transfer.  Name of family member notified:  Lynnea FerrierSolomon- son     PHYSICIAN       Additional Comment:  RN to call [facility] when pt is ready for d/c.  Discussed with MD who reports no changes to previous d/c summary.  _______________________________________________ Karn CassisStultz, Allessandra Bernardi Shanaberger, LCSW 12/12/2015, 1:52 PM [phone]

## 2016-02-16 DEATH — deceased

## 2018-03-09 IMAGING — CT CT HEAD W/O CM
3 of 4 series · 18 of 47 positions shown, 21 images · non-contrast
Comparison: 02/17/2005

CLINICAL DATA: Altered mental status

EXAM:
CT HEAD WITHOUT CONTRAST
TECHNIQUE: Contiguous axial images were obtained from the base of the skull
through the vertex without intravenous contrast.

[Series 201: head w/o, idose (1) · axial · non-contrast · 0.41mm/px · z∈[+300,+420]mm · 12 of 29 slices shown, 15 images]
[im 3/29  brain]
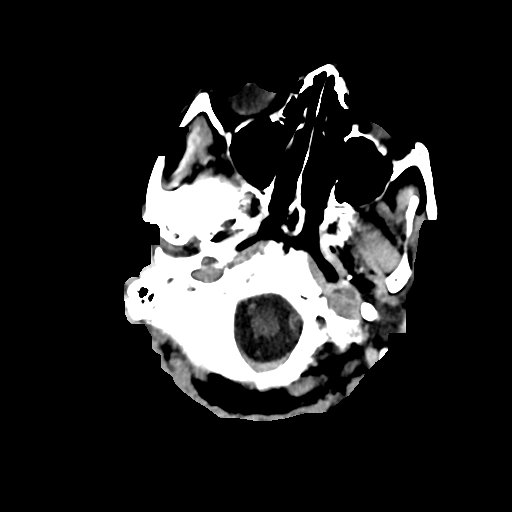
[im 3/29  bone]
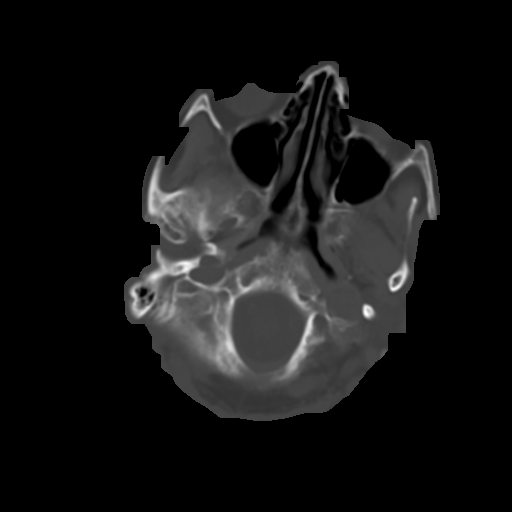
[im 5/29  brain]
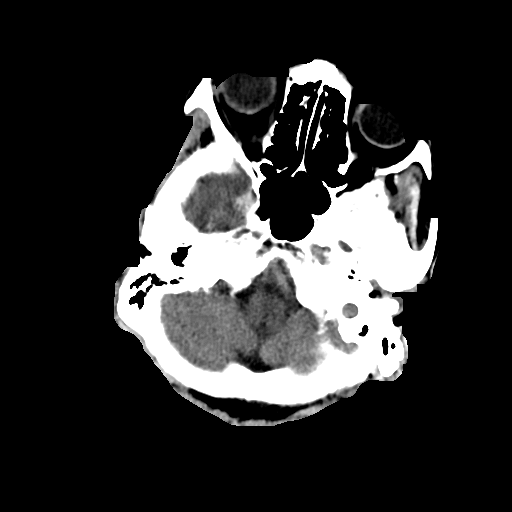
[im 7/29  brain]
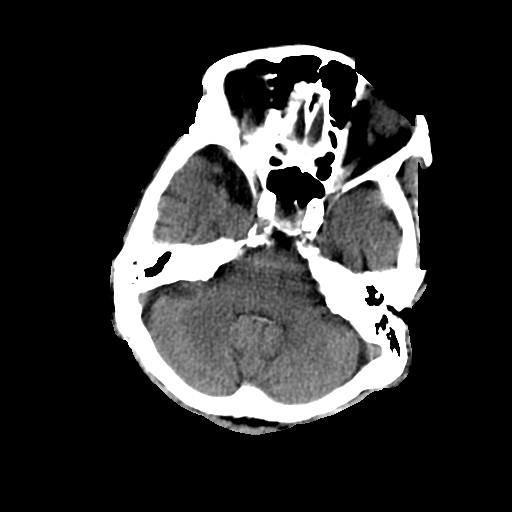
[im 9/29  brain]
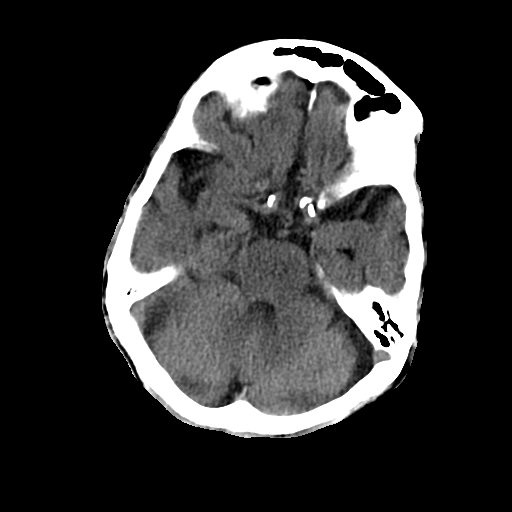
[im 11/29  brain]
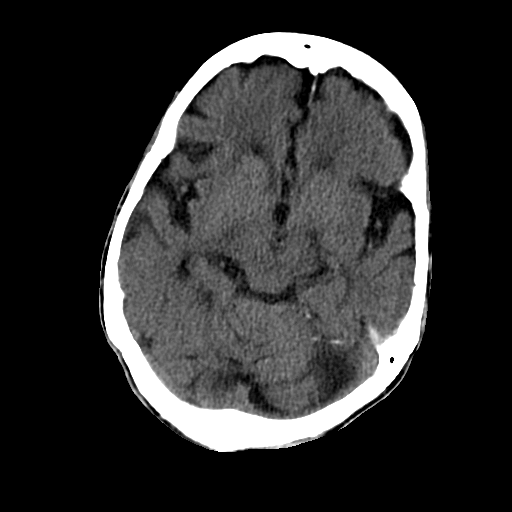
[im 11/29  bone]
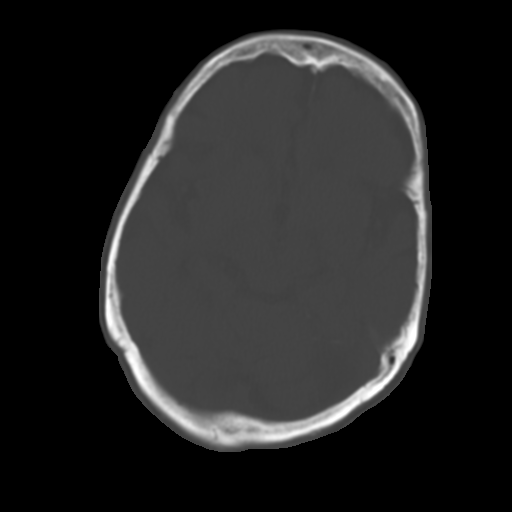
[im 13/29  brain]
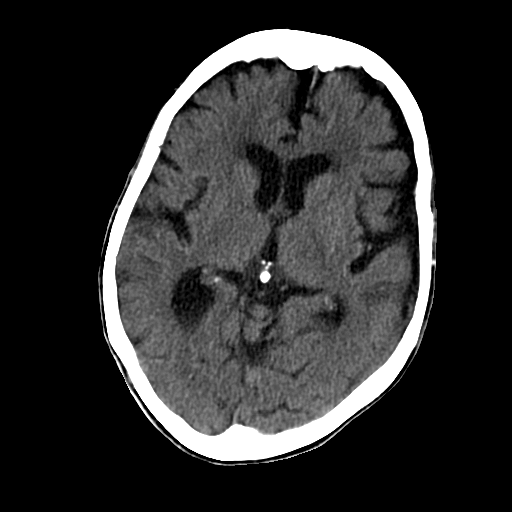
[im 17/29  brain]
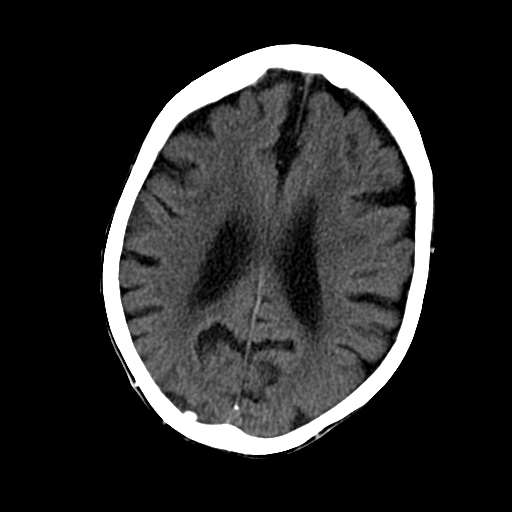
[im 19/29  brain]
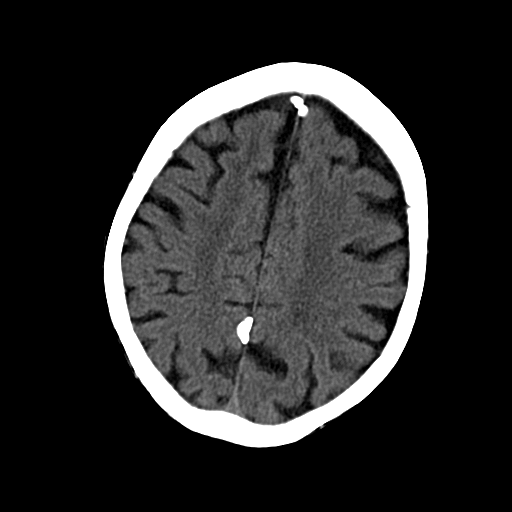
[im 21/29  brain]
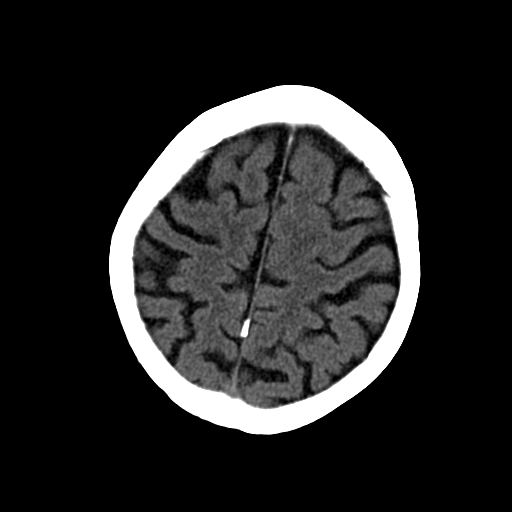
[im 21/29  bone]
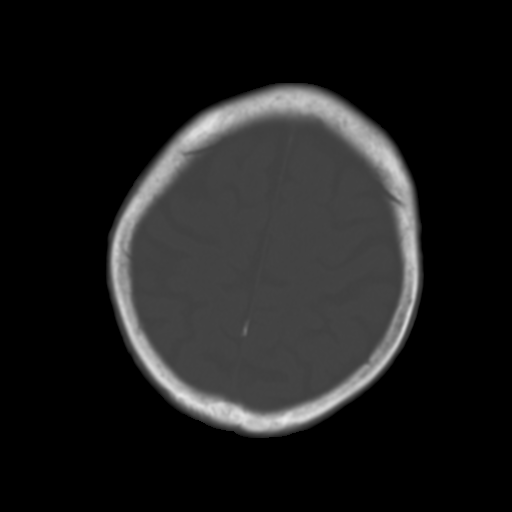
[im 23/29  brain]
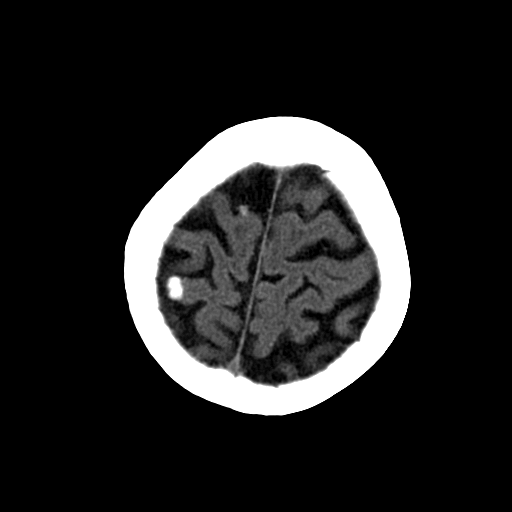
[im 25/29  brain]
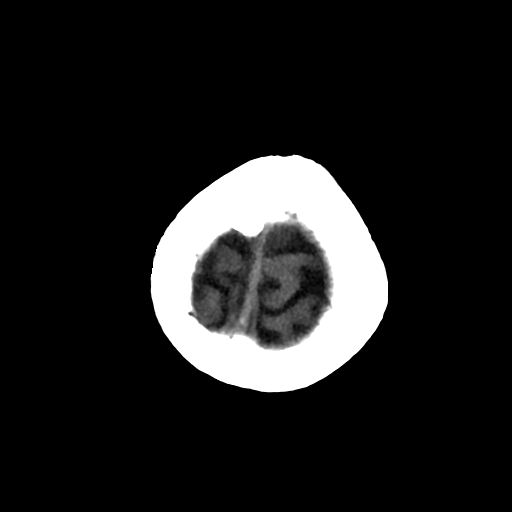
[im 27/29  brain]
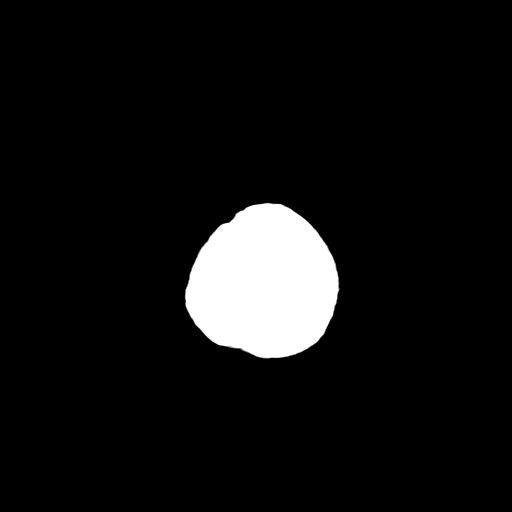

[Series 204: sagittal st, idose (1) · sagittal · 0.40mm/px · 3 of 70 slices shown]
[im 24/70  brain]
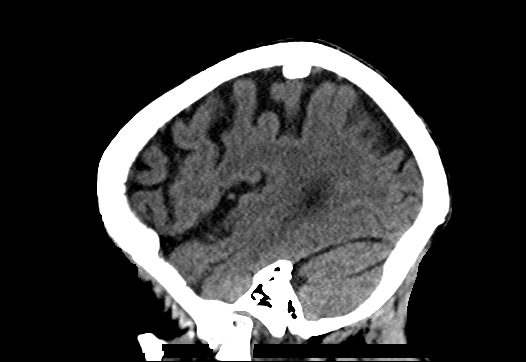
[im 35/70  brain]
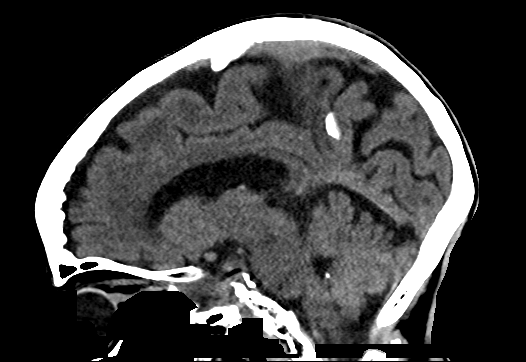
[im 47/70  brain]
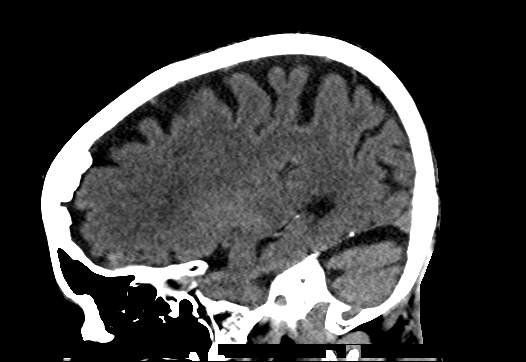

[Series 205: coronal st, idose (1) · coronal · 0.40mm/px · 3 of 66 slices shown]
[im 22/66  brain]
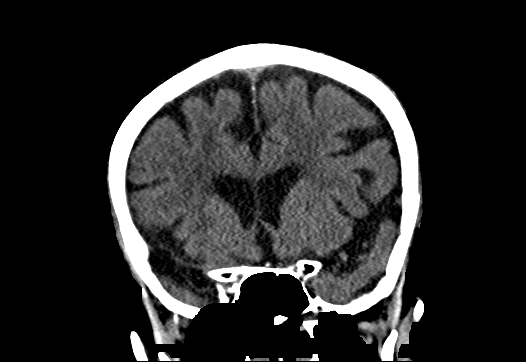
[im 29/66  brain]
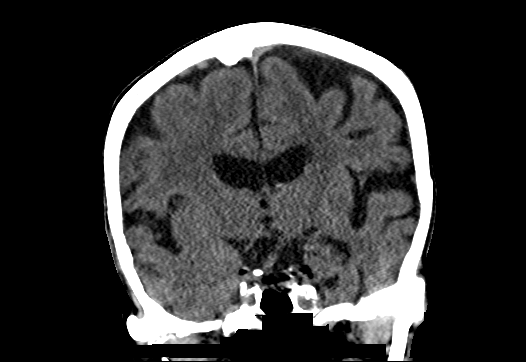
[im 37/66  brain]
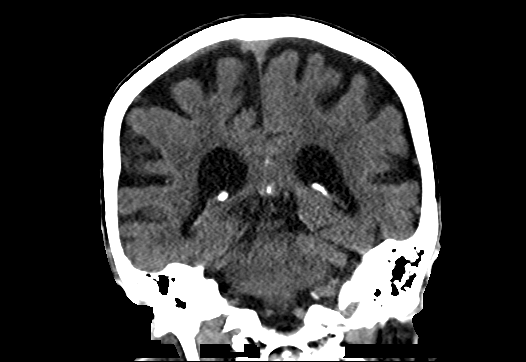

[18 of 47 positions shown; findings below may reference images not displayed]

FINDINGS: No evidence of parenchymal hemorrhage or extra-axial fluid
collection. No mass lesion, mass effect, or midline shift.

No CT evidence of acute infarction.

Subcortical white matter and periventricular small vessel ischemic
changes. Intracranial atherosclerosis.

Age related atrophy.  No ventriculomegaly.

The visualized paranasal sinuses are essentially clear. The mastoid
air cells are unopacified.

No evidence of calvarial fracture.
IMPRESSION: No evidence of acute intracranial abnormality.

Atrophy with small vessel ischemic changes.
